# Patient Record
Sex: Female | Born: 1970 | Race: White
Health system: Southern US, Community
[De-identification: ages and names within clinical notes are randomized; demographics above are authoritative.]

## PROBLEM LIST (undated history)

## (undated) DIAGNOSIS — E119 Type 2 diabetes mellitus without complications: Secondary | ICD-10-CM

## (undated) DIAGNOSIS — I509 Heart failure, unspecified: Secondary | ICD-10-CM

## (undated) DIAGNOSIS — I4891 Unspecified atrial fibrillation: Secondary | ICD-10-CM

## (undated) HISTORY — PX: HERNIA REPAIR: SHX51

---

## 2014-05-14 IMAGING — CT CT ABD/PEL W CONT
2 of 4 series · 17 of 46 positions shown, 19 images · IV contrast (APPLIED)
Comparison: No prior studies available for comparison.

HISTORY: Abdominal pain.
TECHNIQUE: Axial images through the abdomen and pelvis at 5 mm. Coronal and sagittal images were reformatted. 

IV contrast: 100 cc Optiray 320.
Oral contrast:  None per request. 
I-STAT creatinine:

[Series 4: soft tissue · axial · 0.95mm/px · z∈[-516,-76]mm · 14 of 97 slices shown, 16 images]
[im 5/97  soft-tissue]
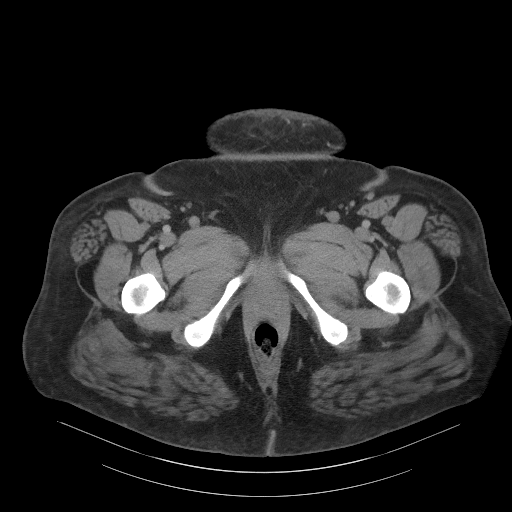
[im 5/97  bone]
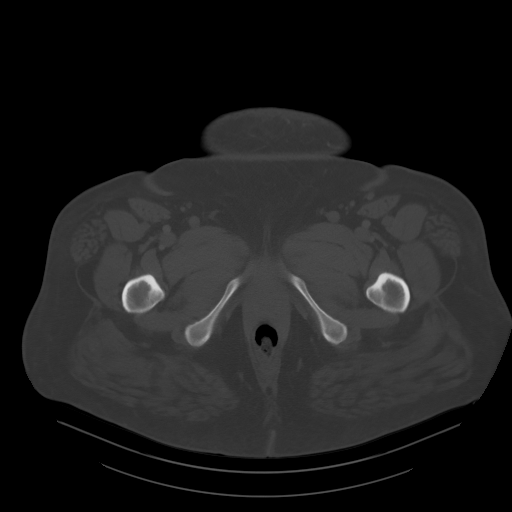
[im 13/97  soft-tissue]
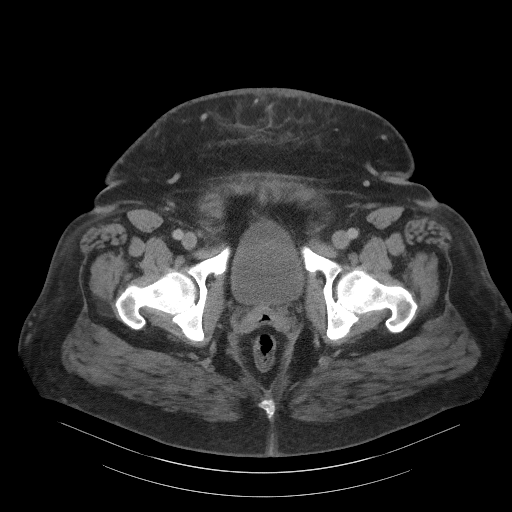
[im 21/97  soft-tissue]
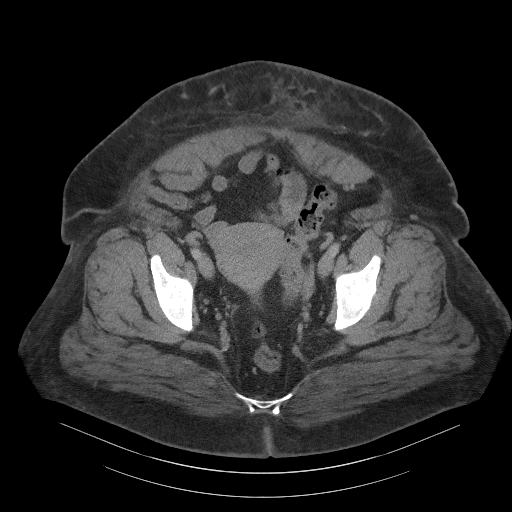
[im 25/97  soft-tissue]
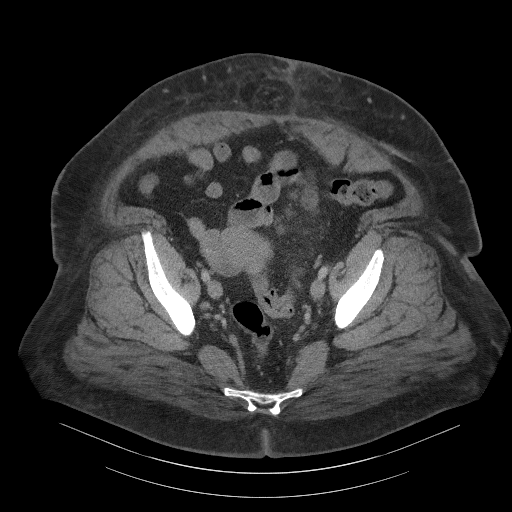
[im 33/97  soft-tissue]
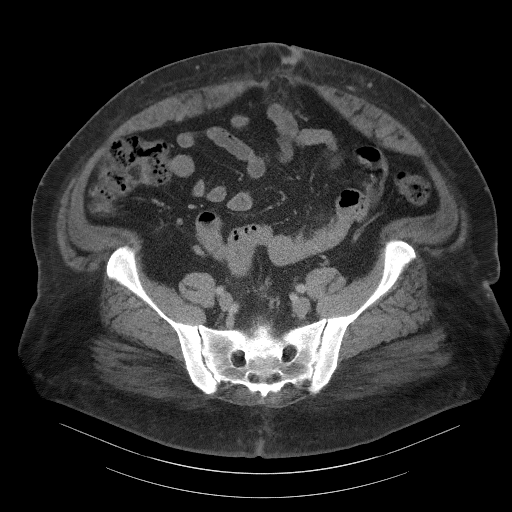
[im 41/97  soft-tissue]
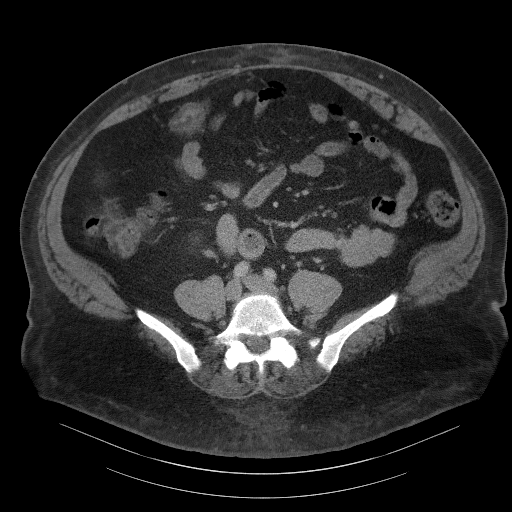
[im 45/97  soft-tissue]
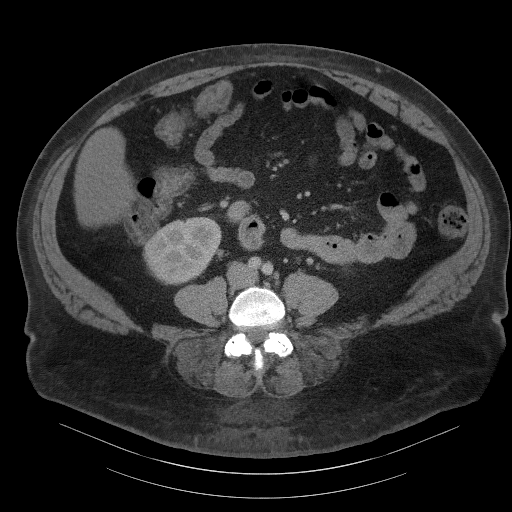
[im 53/97  soft-tissue]
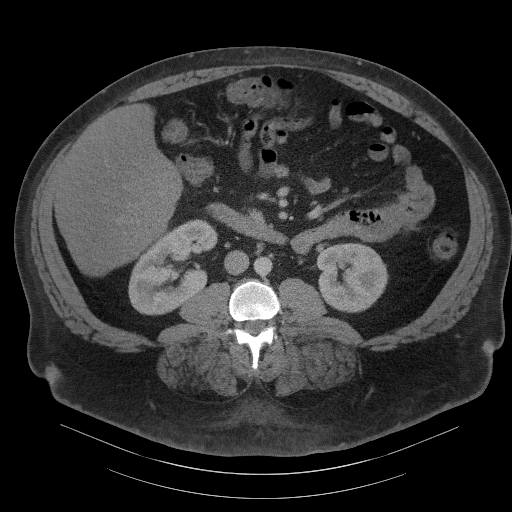
[im 57/97  soft-tissue]
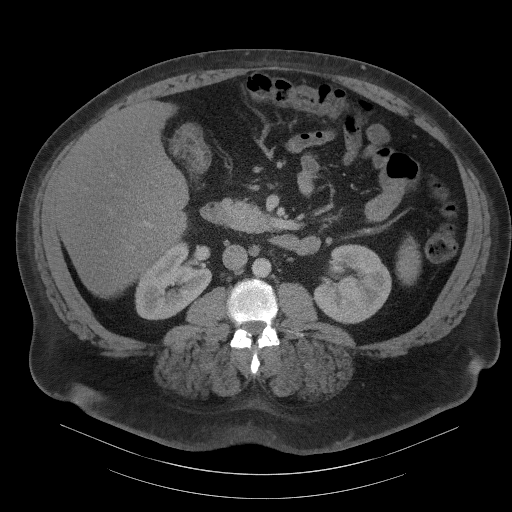
[im 57/97  bone]
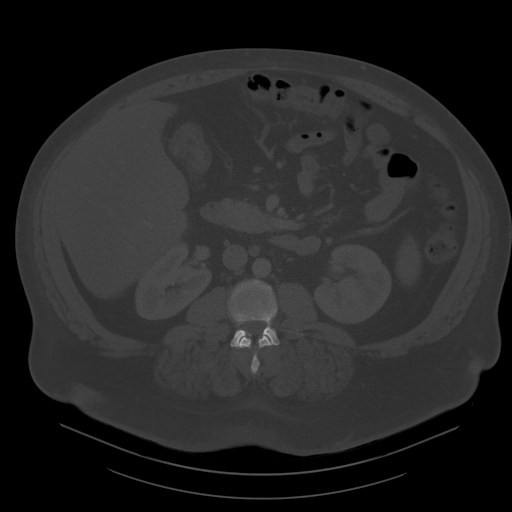
[im 65/97  soft-tissue]
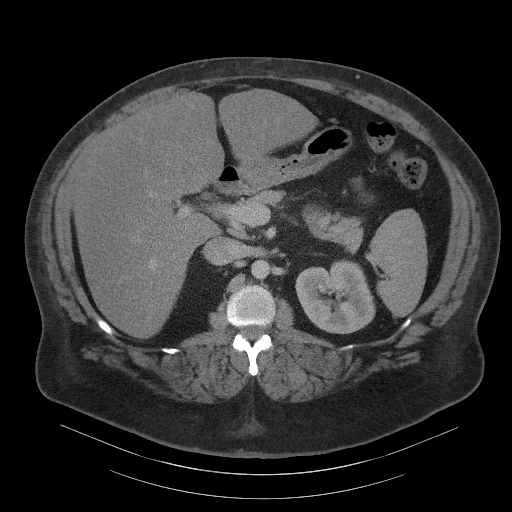
[im 73/97  soft-tissue]
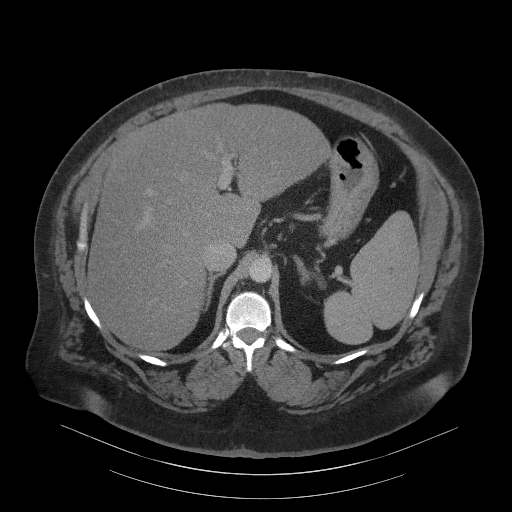
[im 77/97  soft-tissue]
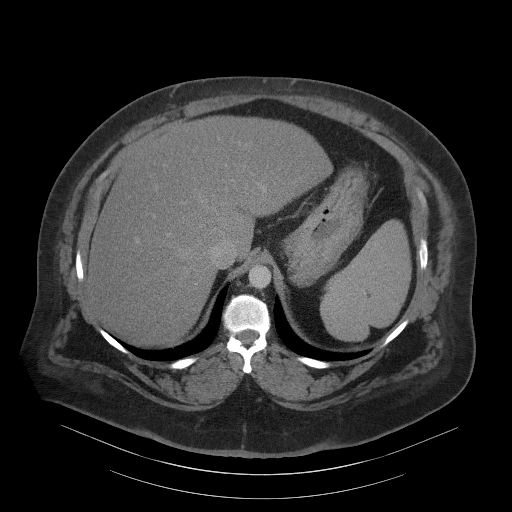
[im 85/97  soft-tissue]
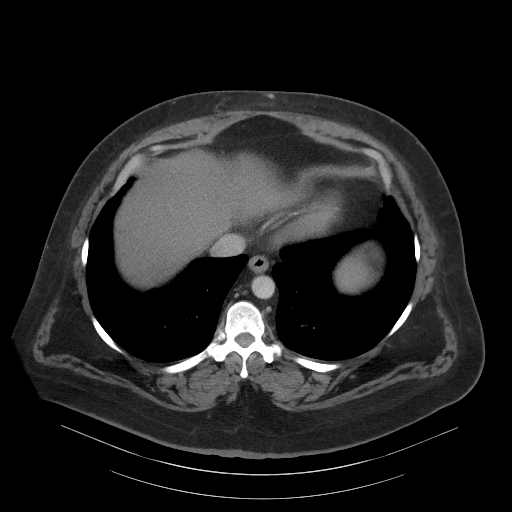
[im 93/97  soft-tissue]
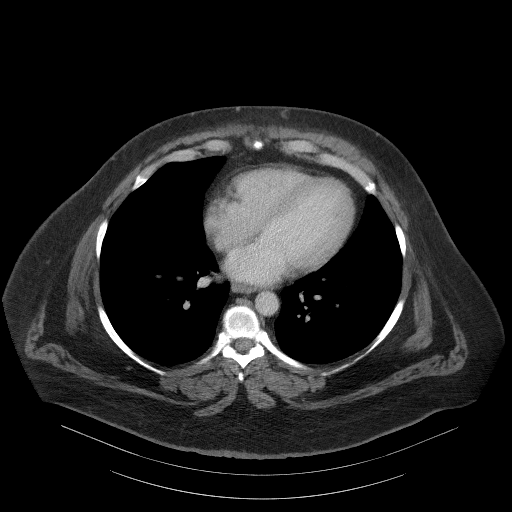

[Series 6: coronal · coronal · 0.98mm/px · 3 of 75 slices shown]
[im 25/75  soft-tissue]
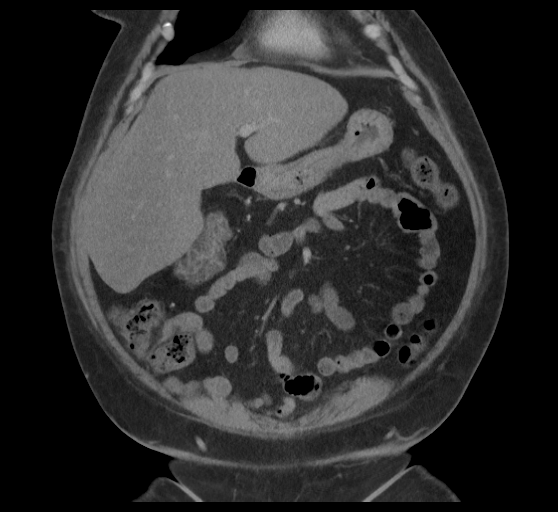
[im 33/75  soft-tissue]
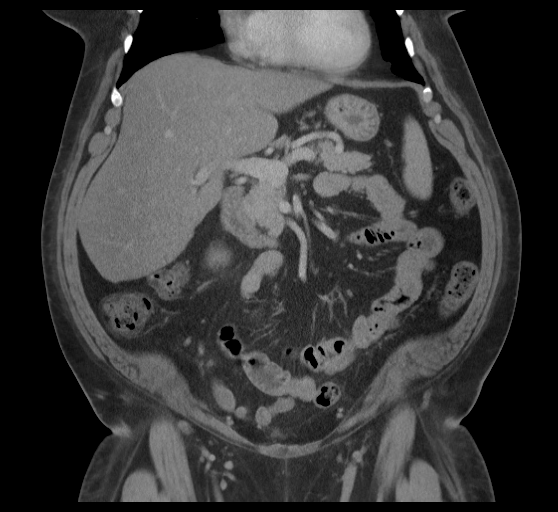
[im 42/75  soft-tissue]
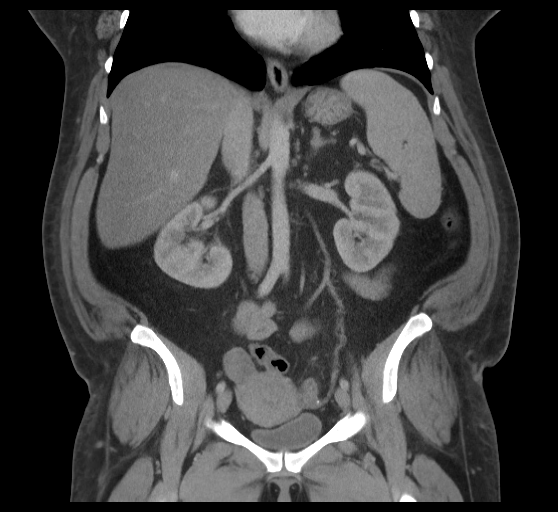

[17 of 46 positions shown; findings below may reference images not displayed]

FINDINGS: The lung bases are clear.

The liver is enlarged measuring approximately 23 cm craniocaudad dimension. No focal abnormality is visible within the liver. Patient has undergone prior cholecystectomy. The spleen, pancreas, adrenals and kidneys are unremarkable. The abdominal aorta is normal in caliber. Evaluation of the GI tract is limited by the lack of contrast.  No abnormally dilated loops of bowel are identified.

There is a fat-containing umbilical hernia. The fascial defect measures 3 cm on sagittal view. There is stranding in the subcutaneous fat of the lower anterior abdominal wall. It is difficult to determine if this is within the hernia sac itself or adjacent adipose tissue. No loculated fluid collection is demonstrated. There is no bowel involvement of the hernia.

Pelvic contents are grossly unremarkable.

Bone windows show degenerative change in the lower lumbar spine.
IMPRESSION: Fat-containing umbilical hernia. The walls of the hernia sac are not well defined. There is stranding in the lower abdominal wall which may reflect incarceration of the hernia or panniculitis or a combination of both.

## 2014-08-30 IMAGING — CR C-SPINE 4 or 5 views
1 series · 6 of 6 positions shown · non-contrast
Comparison: None.

HISTORY: Radiculopathy, pain.
TECHNIQUE: C-spine x-ray series 6 views.

[Series 2: swimmers · 0.17mm/px · 6 of 6 slices shown]
[im 1/6]
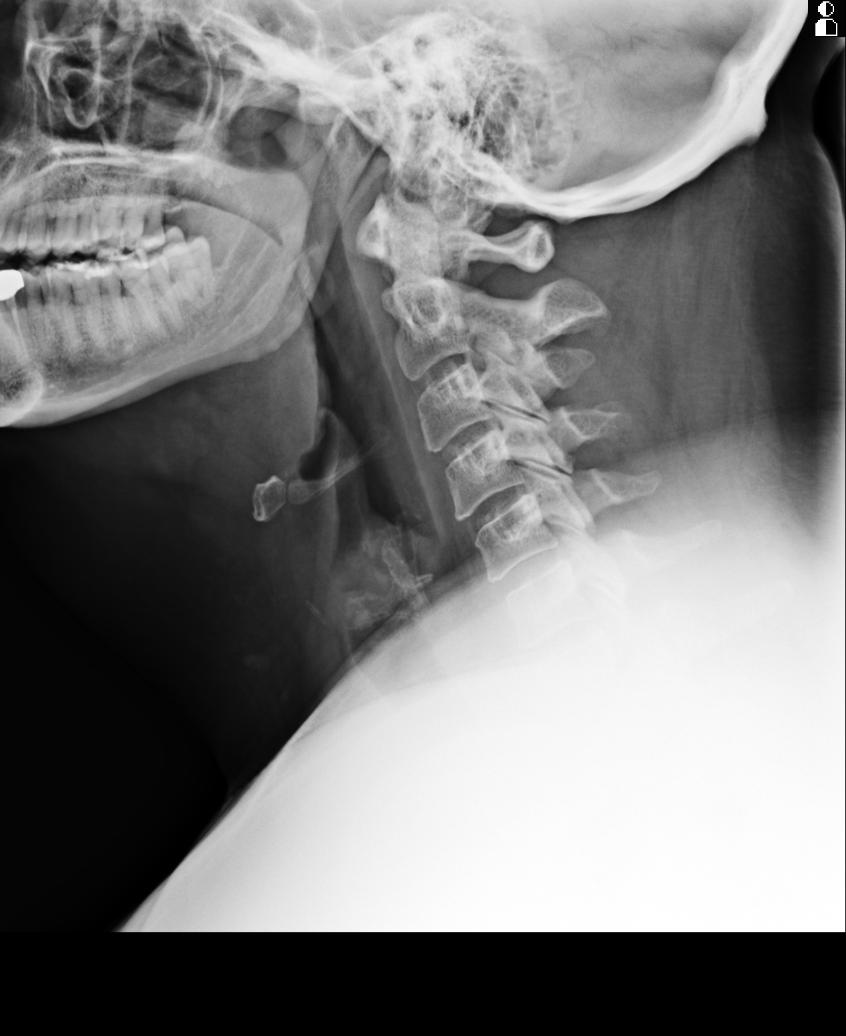
[im 2/6]
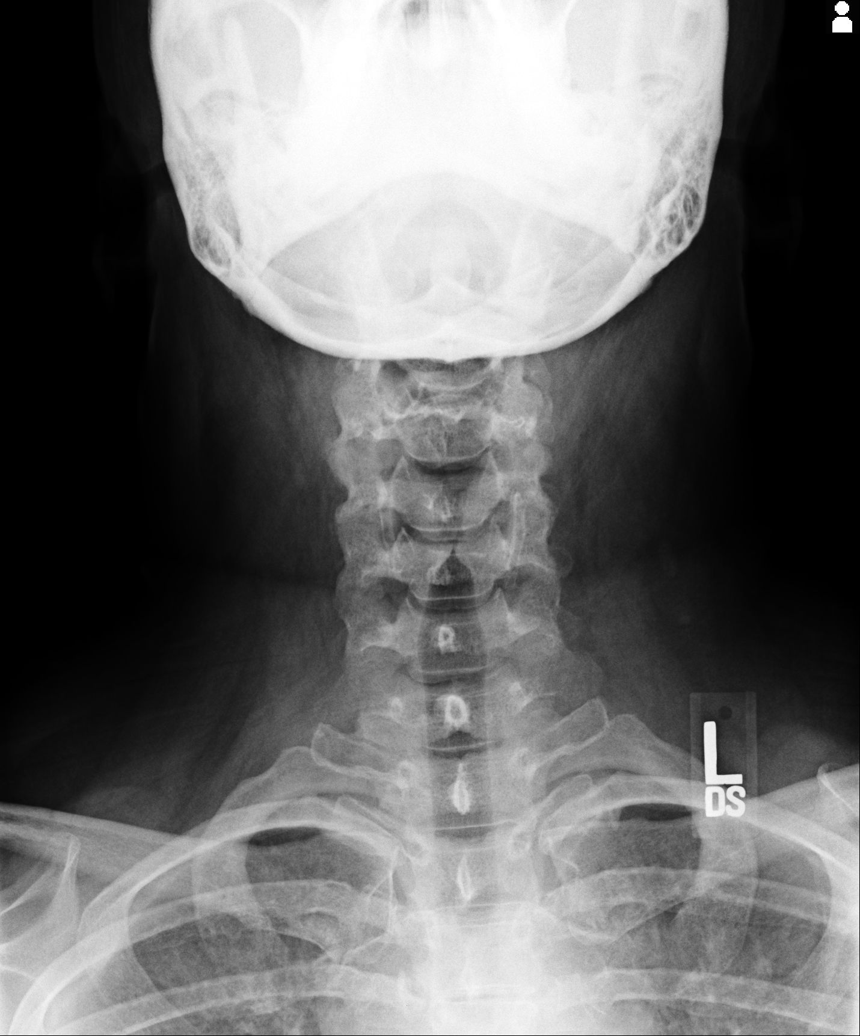
[im 3/6]
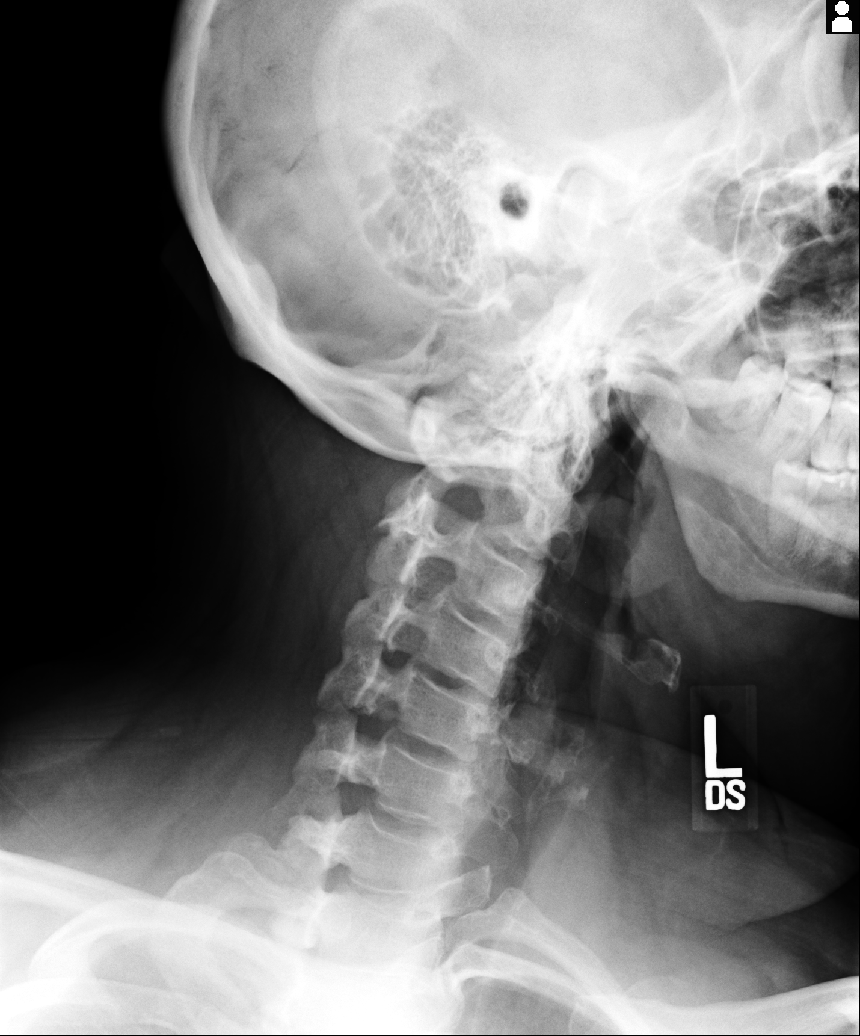
[im 4/6]
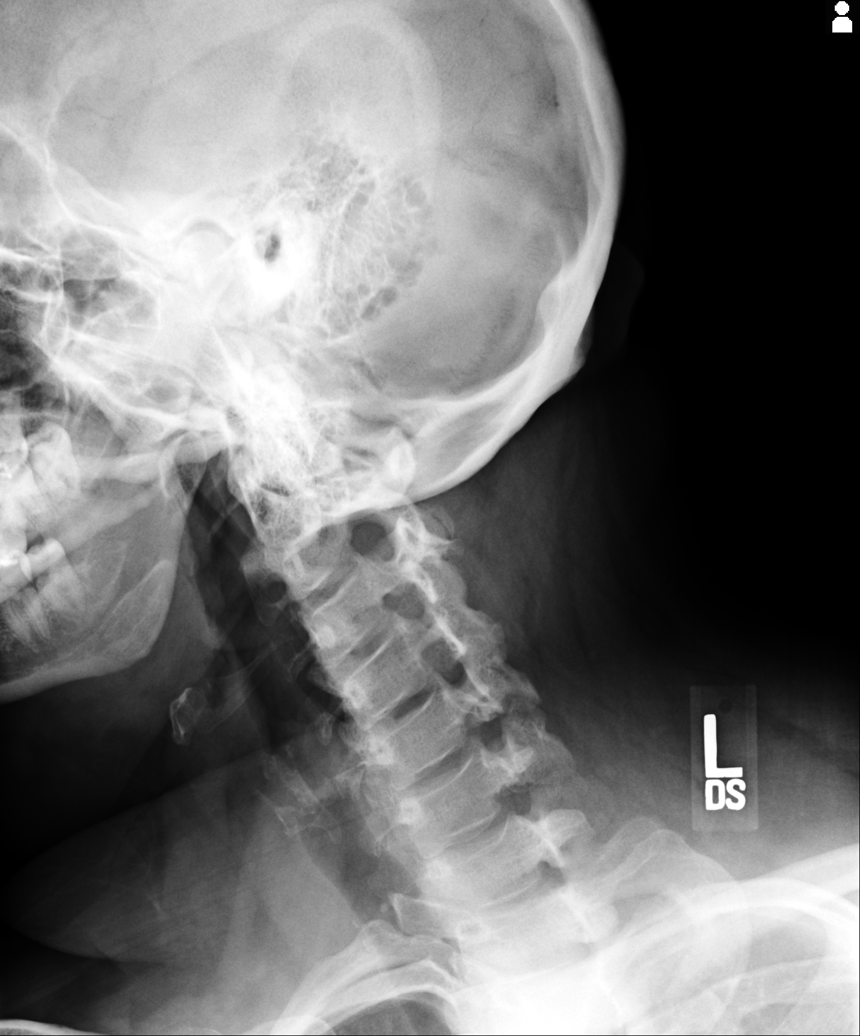
[im 5/6]
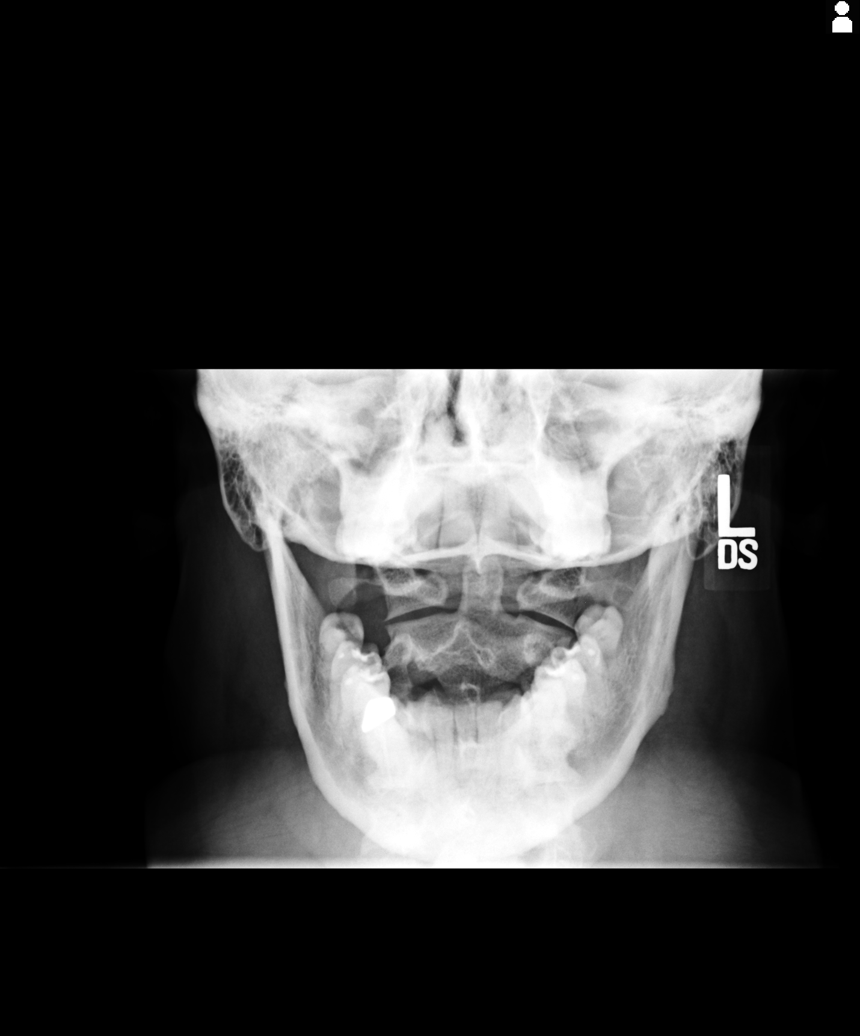
[im 6/6]
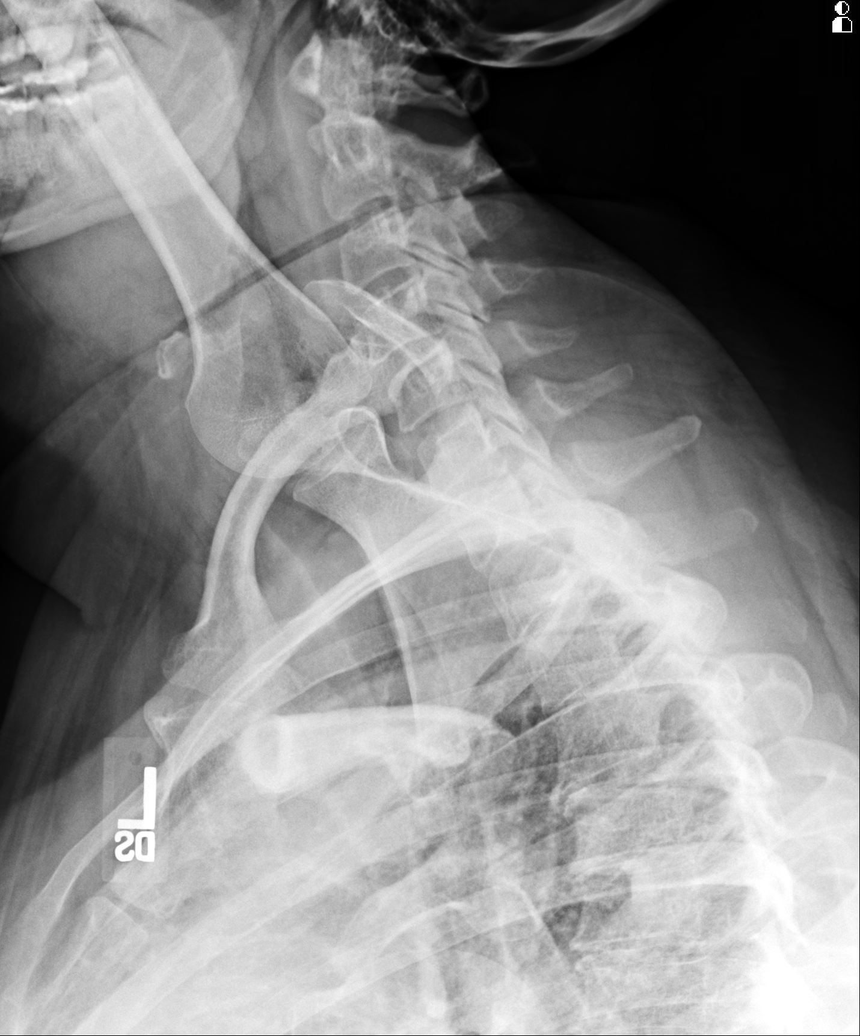

[6 of 6 positions shown; findings below may reference images not displayed]

FINDINGS: C1-C7 are visualized on the swimmer's view. There is no slippage, fracture or disc space narrowing. C1-2 is normal. No prevertebral swelling is seen. Neural foramina are open. Facets unremarkable.
IMPRESSION: Normal C-spine x-ray series.

## 2014-08-30 IMAGING — US US NON OB TRANSVAGINAL W LTD TA
1 series · 14 of 28 positions shown · non-contrast
Comparison: CT abdomen pelvis 05/14/2014

HISTORY: 43 year Female with Pelvic pain.
TECHNIQUE: Transabdominal and transvaginal ultrasound examinations of pelvis were performed.

[Series 1: us non ob transvaginal w ltd ta · 0.28mm/px · 14 of 39 slices shown]
[im 2/39]
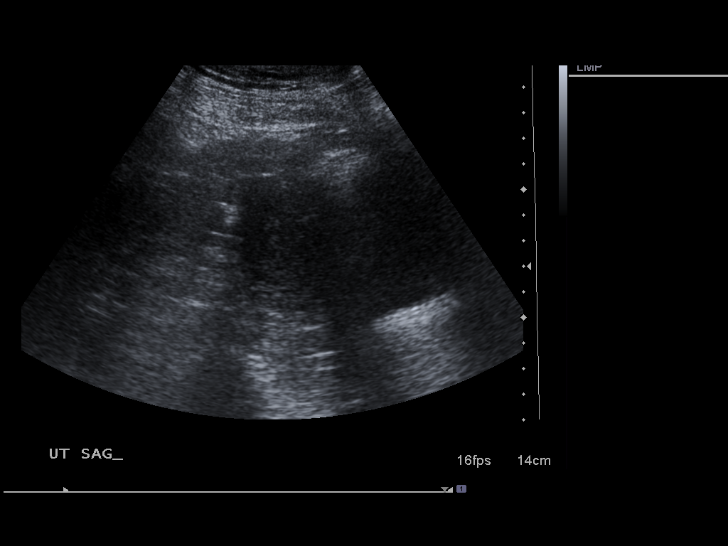
[im 5/39]
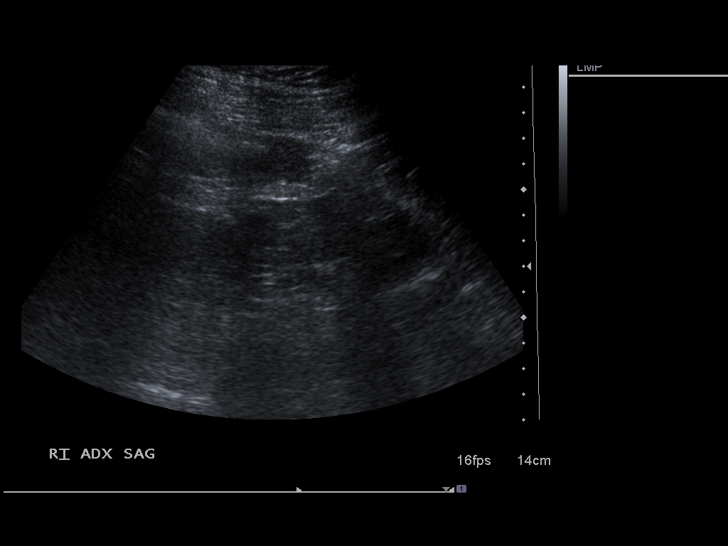
[im 8/39]
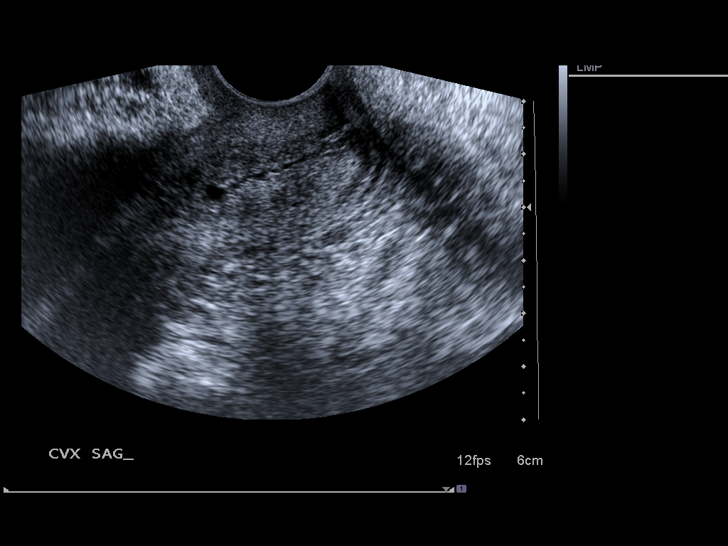
[im 10/39]
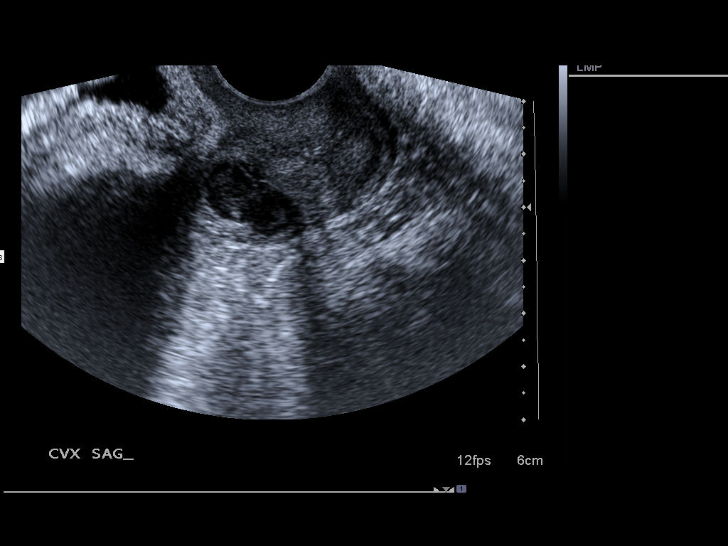
[im 13/39]
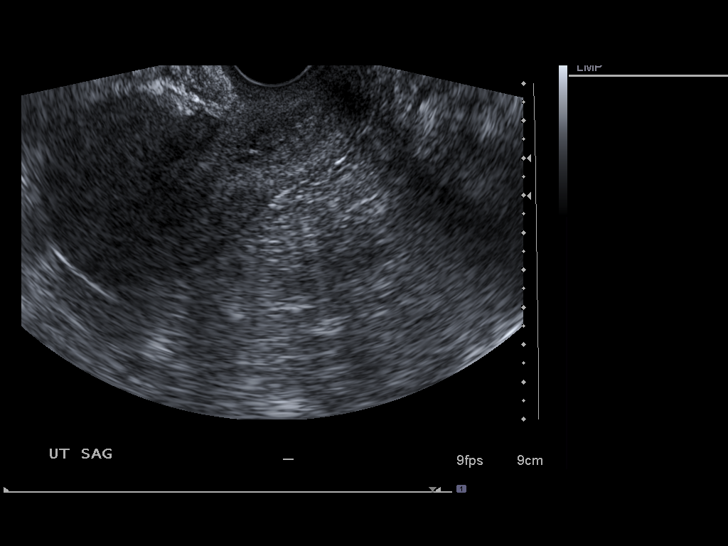
[im 16/39]
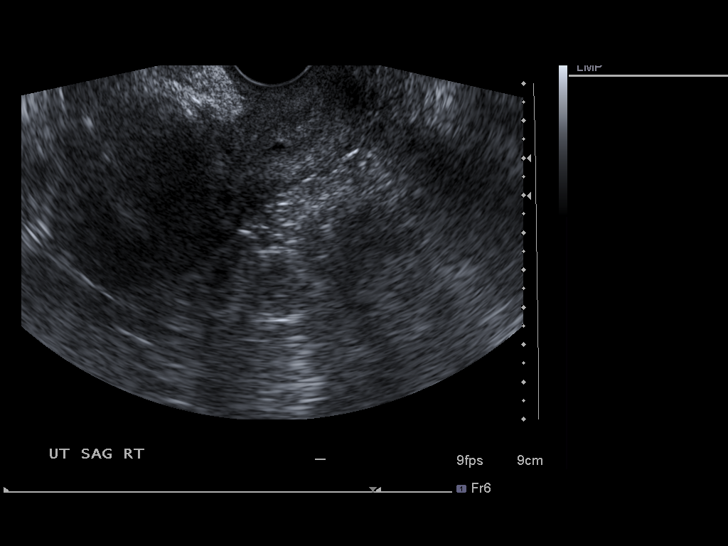
[im 19/39]
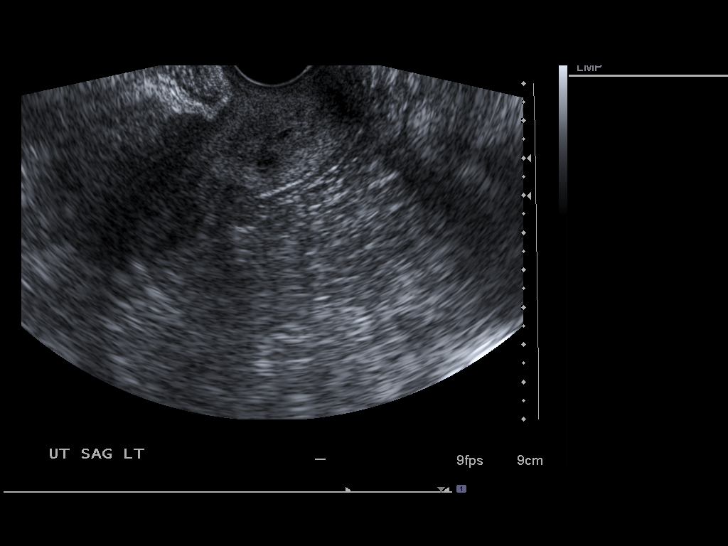
[im 22/39]
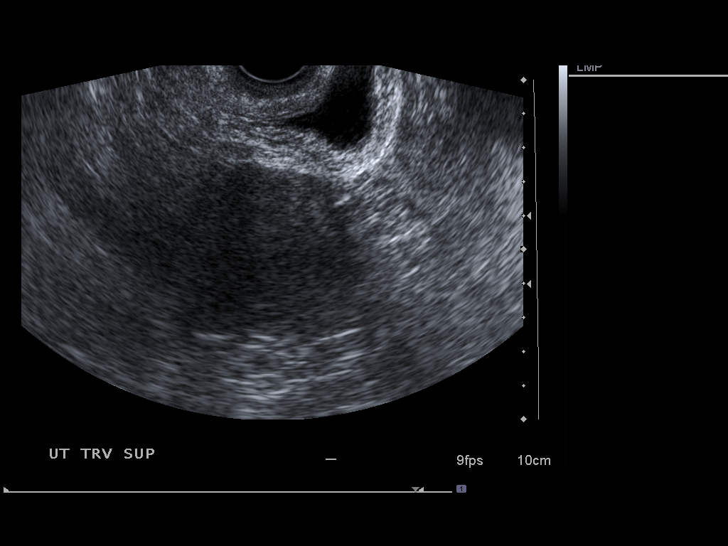
[im 24/39]
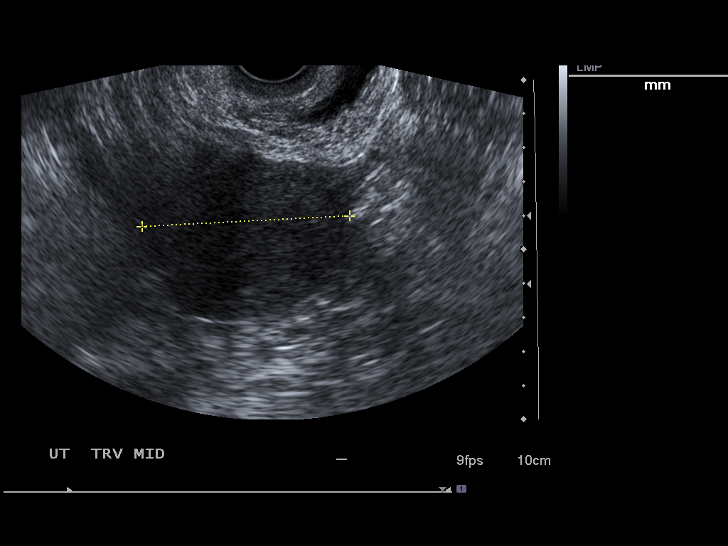
[im 27/39]
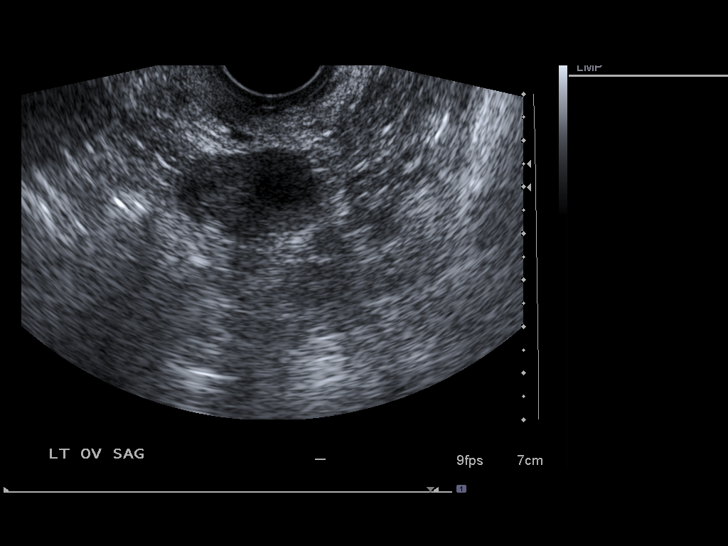
[im 30/39]
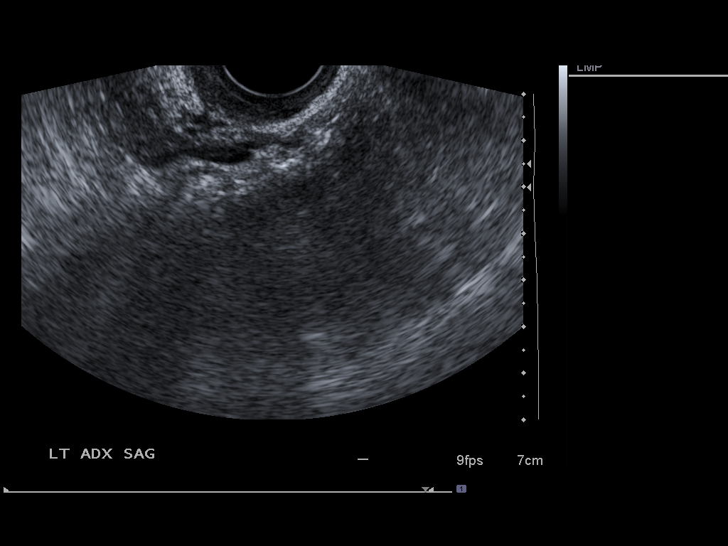
[im 33/39]
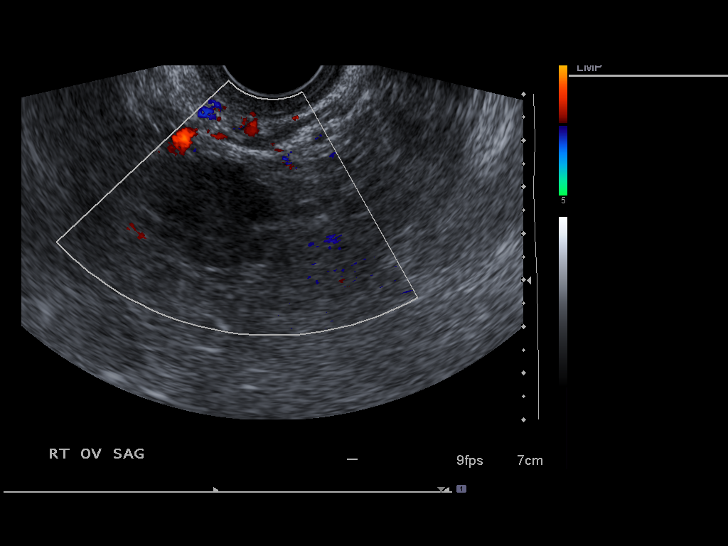
[im 36/39]
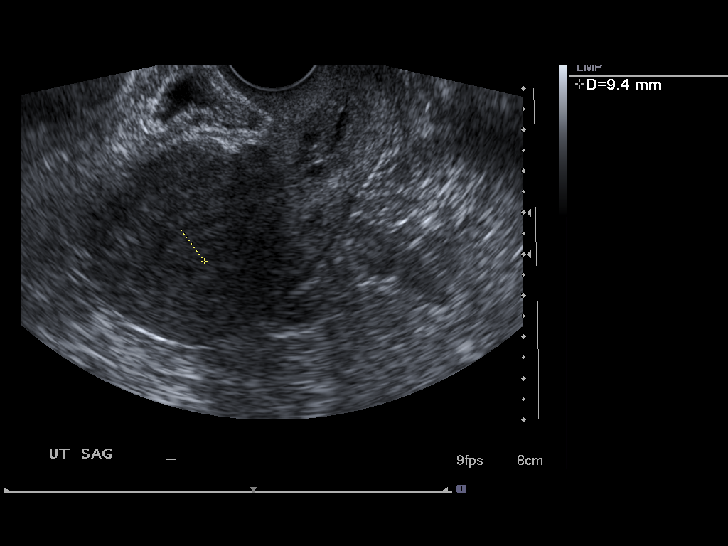
[im 39/39]
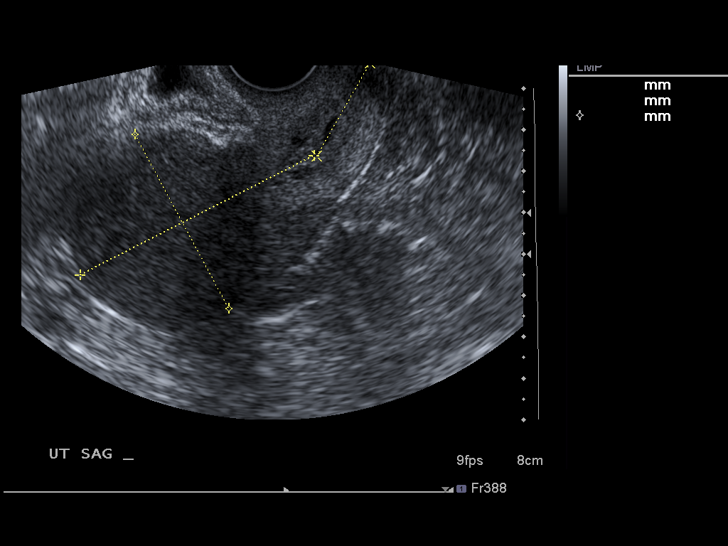

[14 of 28 positions shown; findings below may reference images not displayed]

FINDINGS: The uterus is anteverted and homogenous in echogenicity. The uterus measures 8.9 x 4.8 x 6.1 cm. 

In the right posterior cervical wall, there is a lobulated hypoechoic and anechoic lesion measuring 2.1 x 1.2 x 1.5 cm. This contains no internal vascularity on color Doppler imaging. This probably represents a complicated nabothian cyst with internal proteinaceous debris.

The endometrium measures 9 mm and is uniform in appearance. 

The right ovary measures 3.3 x 2.5 x 1.6 cm. There are small follicles in the right ovary measuring up to 1.2 cm.

The left ovary measures 3.3 x 2.2 x 2.3 cm. There are small follicles in the left ovary, measuring up to 1.9 cm.

There is no free fluid in the pelvis.
IMPRESSION: Lobulated cystic structure in the right posterior cervical wall with internal echoes, measures up to 2.1 cm. This likely represents a complicated nabothian cyst with internal proteinaceous debris. Recommend correlation with physical exam and Pap smear.

## 2014-08-30 IMAGING — US US THYROID
1 series · 14 of 23 positions shown · non-contrast
Comparison: No prior studies are available for review.

HISTORY: Enlarged thyroid nodules.
TECHNIQUE: Thyroid ultrasound.

[Series 1: us thyroid · 0.09mm/px · 14 of 23 slices shown]
[im 1/23]
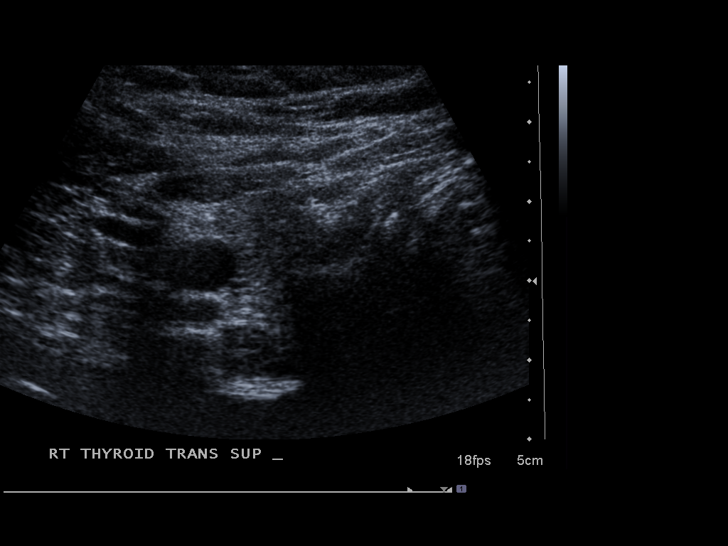
[im 3/23]
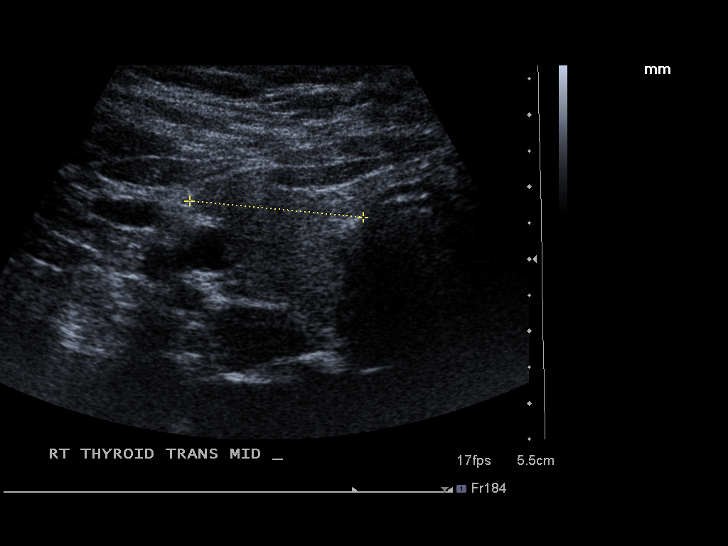
[im 5/23]
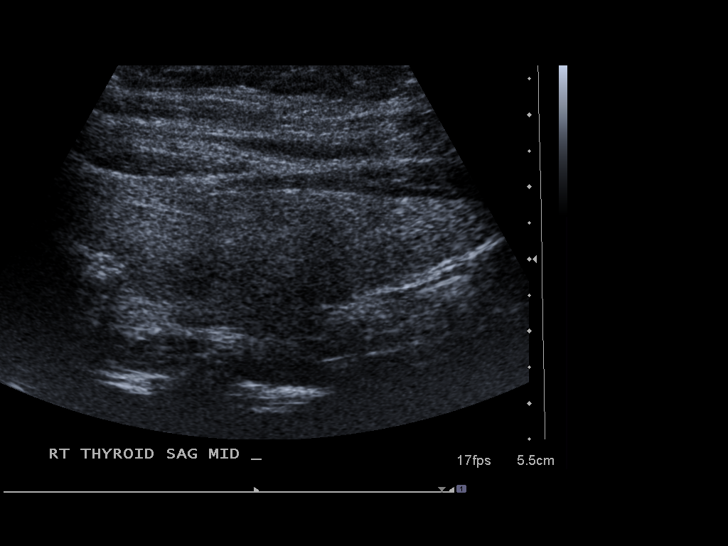
[im 6/23]
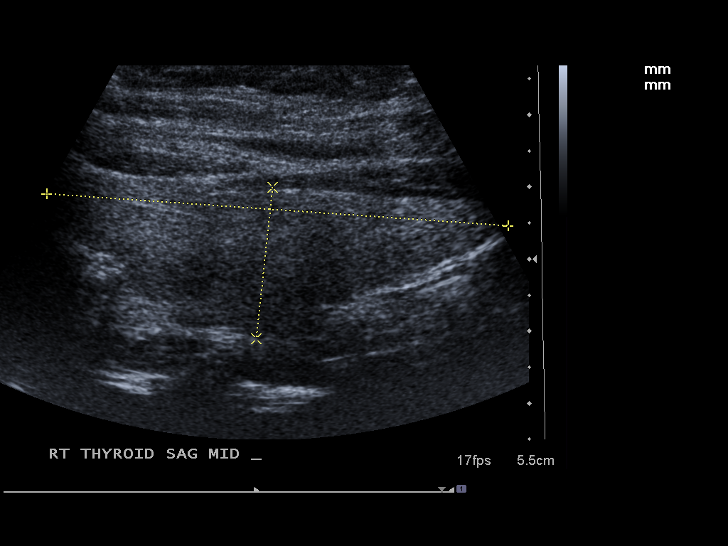
[im 8/23]
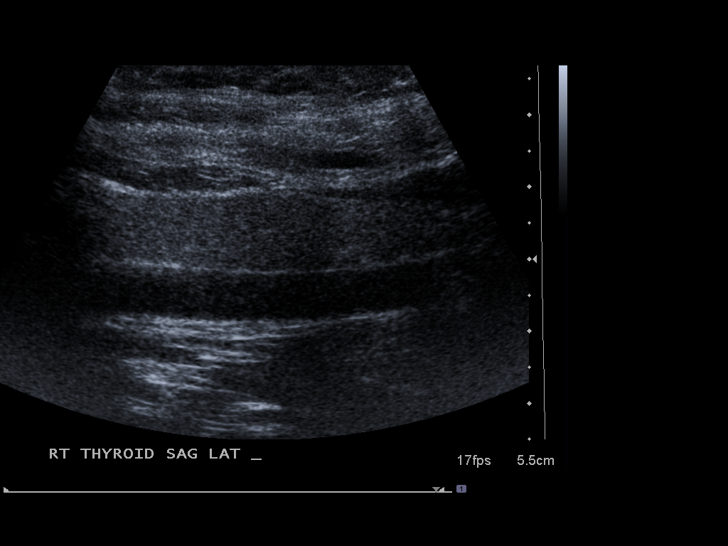
[im 10/23]
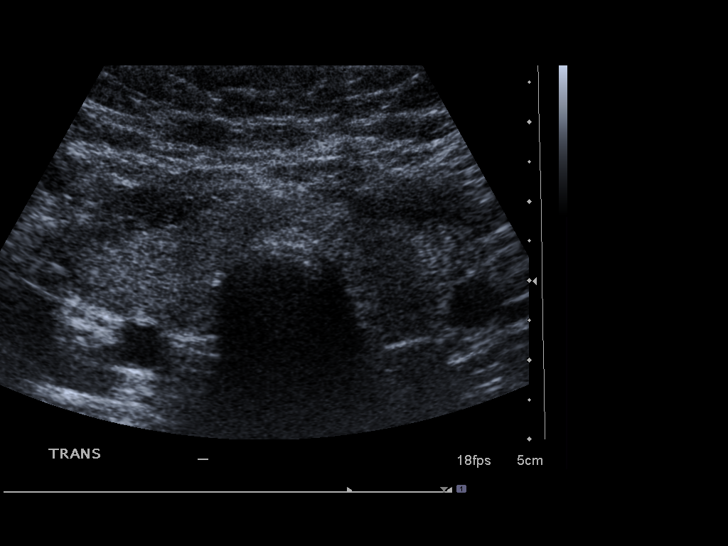
[im 11/23]
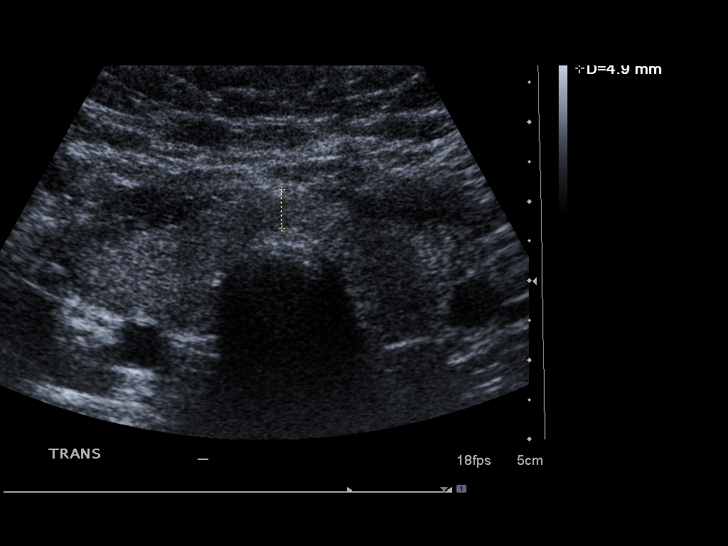
[im 13/23]
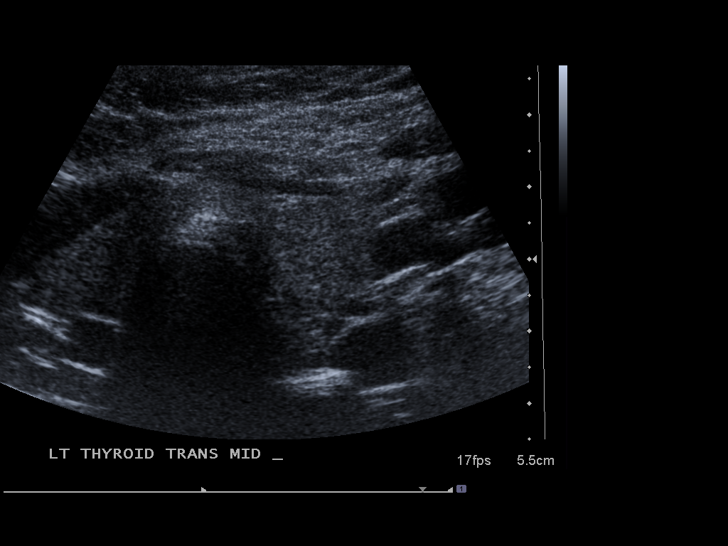
[im 14/23]
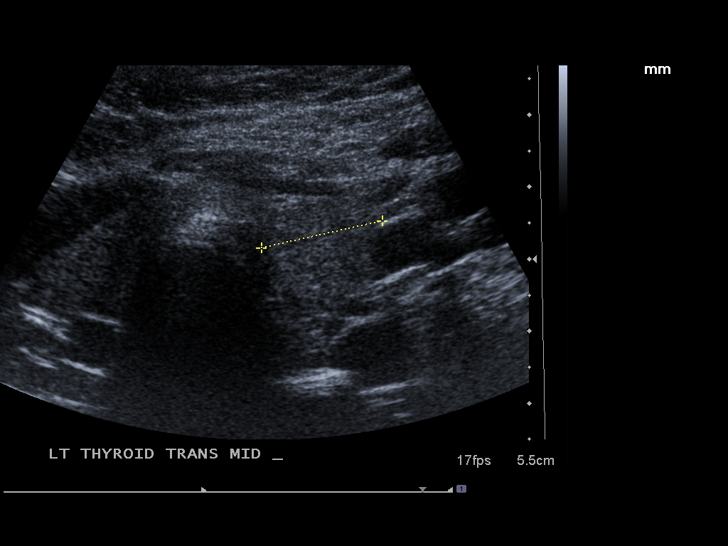
[im 16/23]
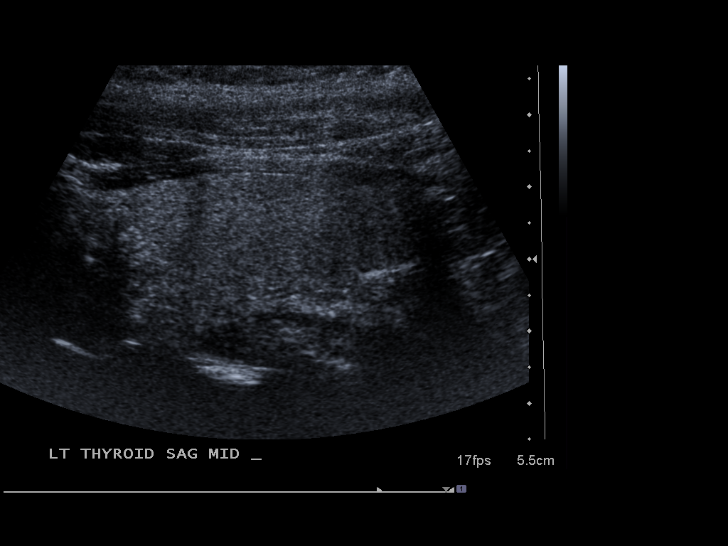
[im 18/23]
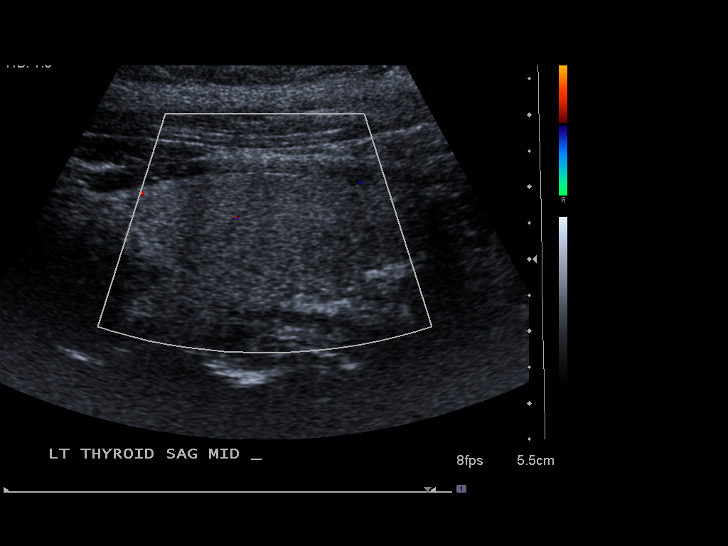
[im 19/23]
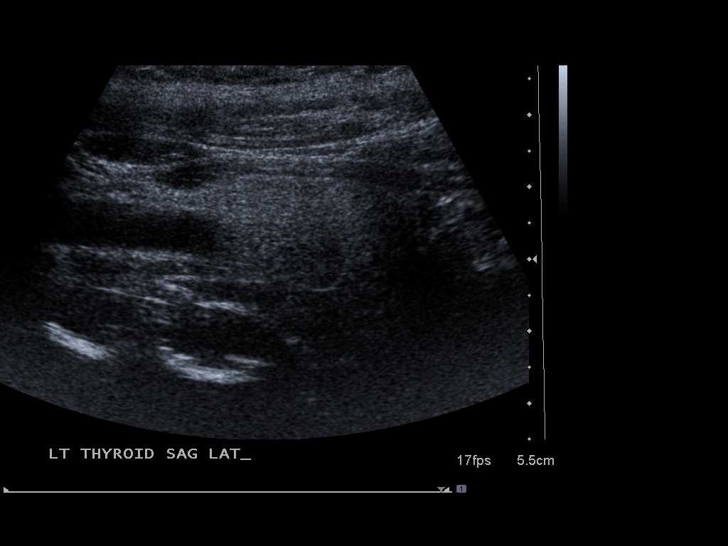
[im 21/23]
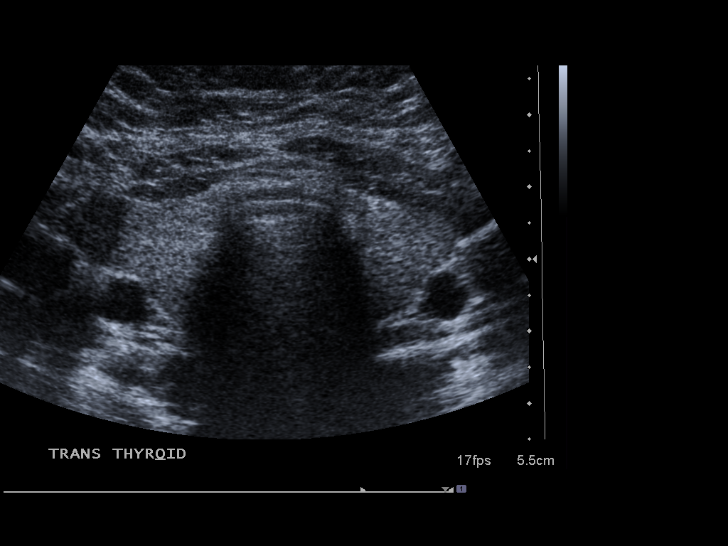
[im 23/23]
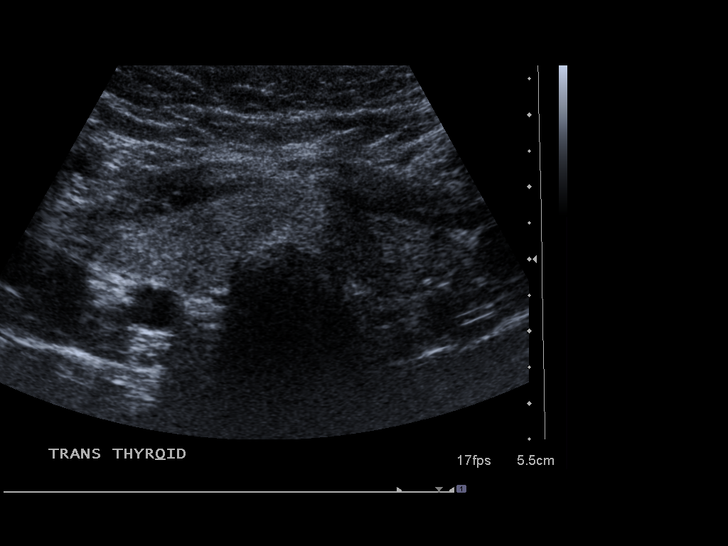

[14 of 23 positions shown; findings below may reference images not displayed]

FINDINGS: There is mild inhomogeneous echotexture of both thyroid notes without colloid cyst or nodule. No increased blood flow is noted within the thyroid gland.

RIGHT LOBE:  64 x 21 x 24 mm  

LEFT LOBE:  47 x 23 x 17 mm

ISTHMUS:  5 mm

THYROID NODULES: None.
IMPRESSION: Mild thyromegaly without convincing colloid cyst or solid nodule.

## 2015-01-31 IMAGING — CT CT ABD/PEL W CONT
2 of 4 series · 17 of 46 positions shown, 19 images · IV contrast (APPLIED)
Comparison: Previous CT of the abdomen and pelvis May 14, 2014.

INDICATION: Epigastric pain.

[Series 3: soft tissue · axial · 0.97mm/px · z∈[-551,-141]mm · 14 of 90 slices shown, 16 images]
[im 4/90  soft-tissue]
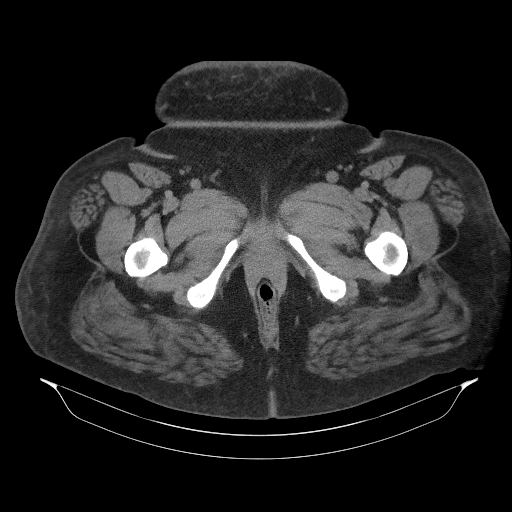
[im 4/90  bone]
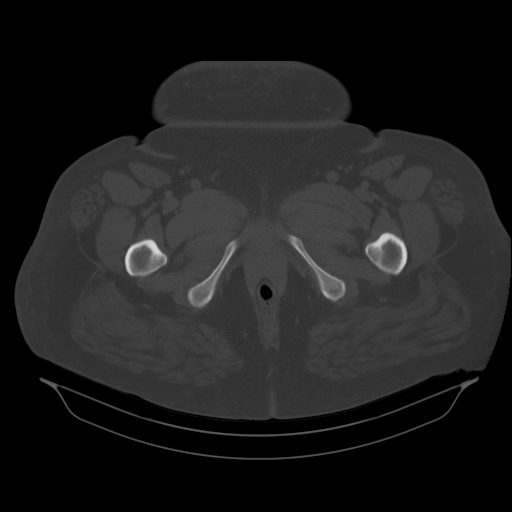
[im 11/90  soft-tissue]
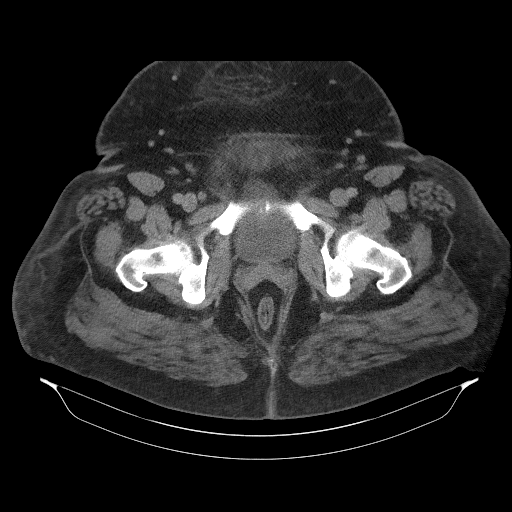
[im 18/90  soft-tissue]
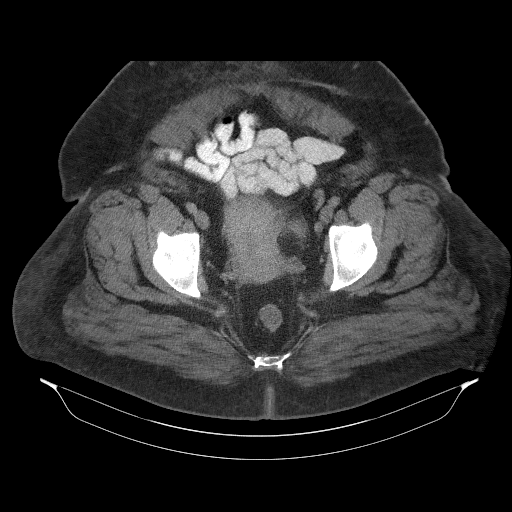
[im 25/90  soft-tissue]
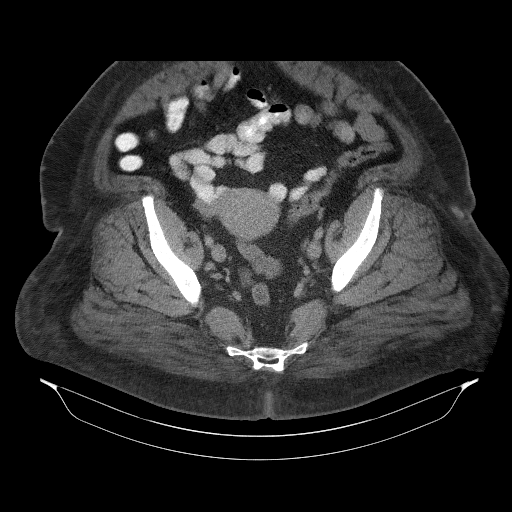
[im 29/90  soft-tissue]
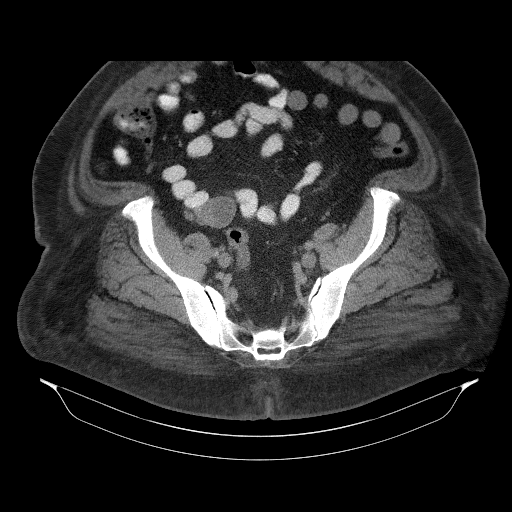
[im 36/90  soft-tissue]
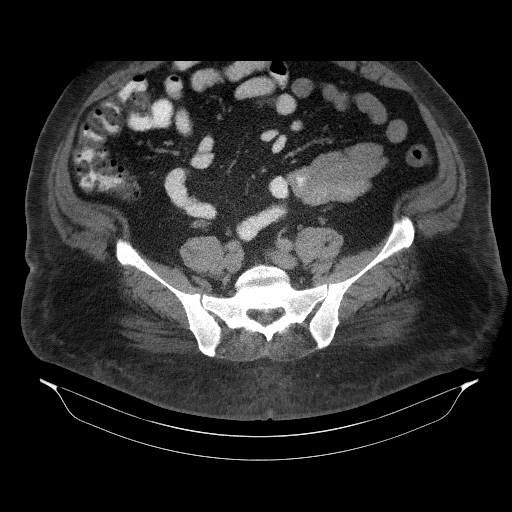
[im 43/90  soft-tissue]
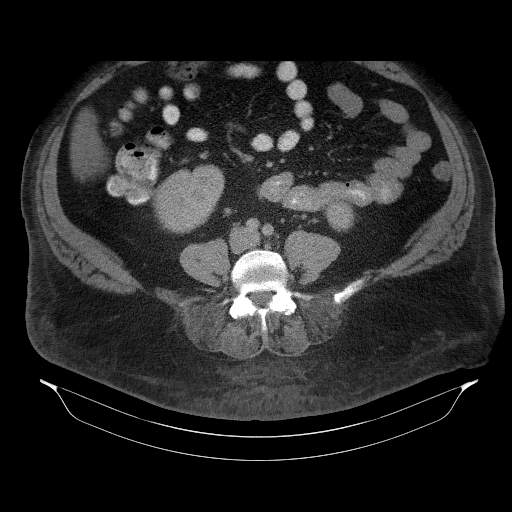
[im 47/90  soft-tissue]
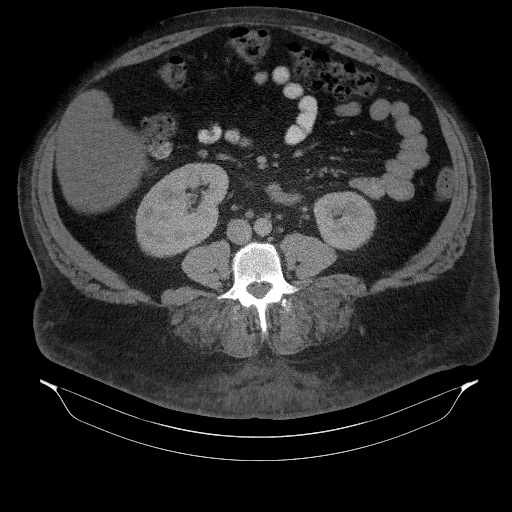
[im 54/90  soft-tissue]
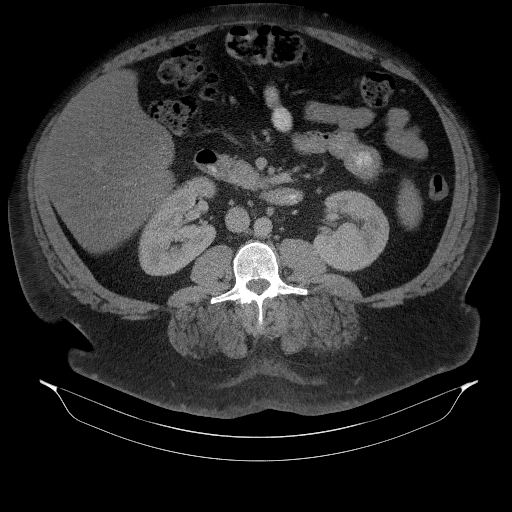
[im 54/90  bone]
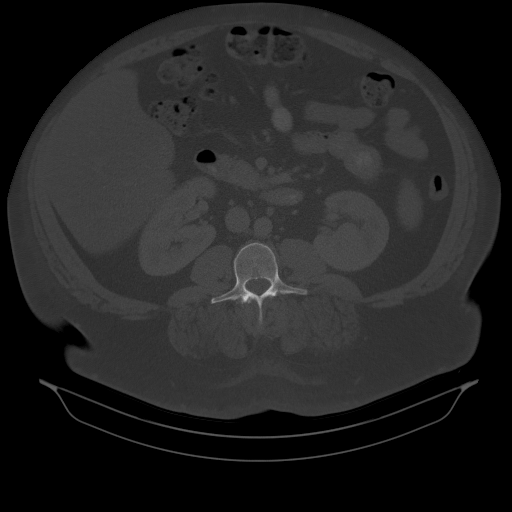
[im 61/90  soft-tissue]
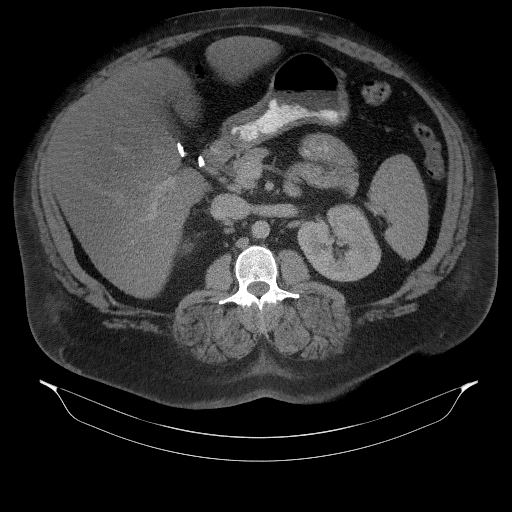
[im 68/90  soft-tissue]
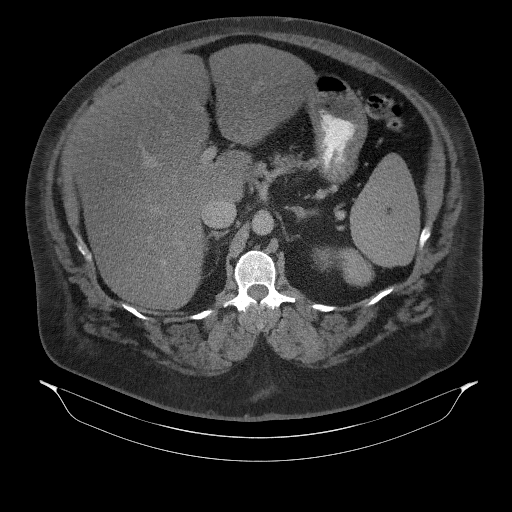
[im 72/90  soft-tissue]
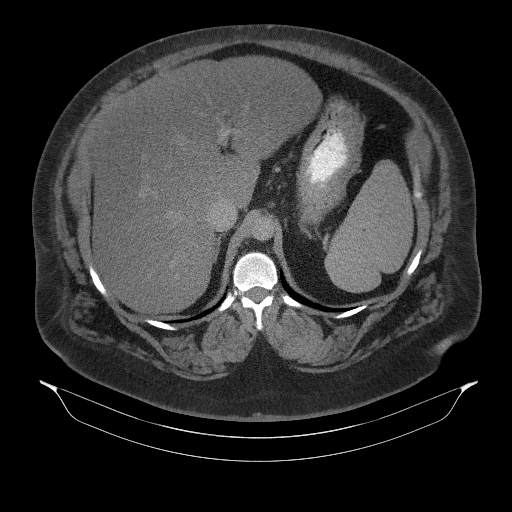
[im 79/90  soft-tissue]
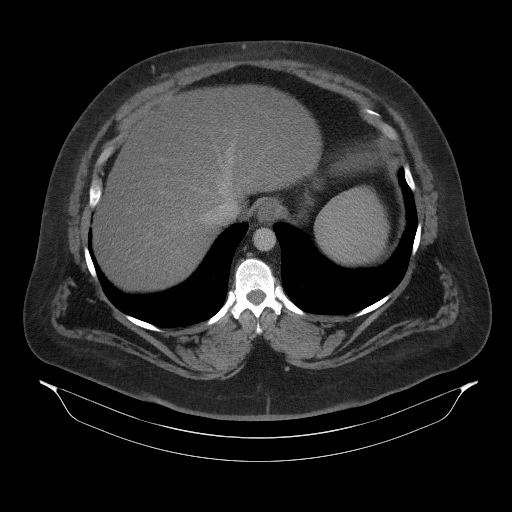
[im 86/90  soft-tissue]
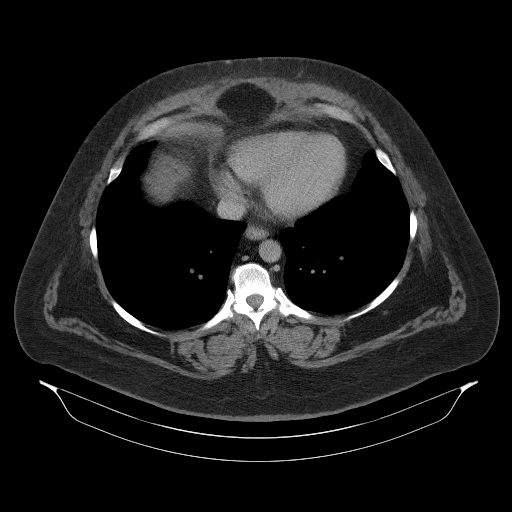

[Series 5: coronal · coronal · 0.91mm/px · 3 of 74 slices shown]
[im 25/74  soft-tissue]
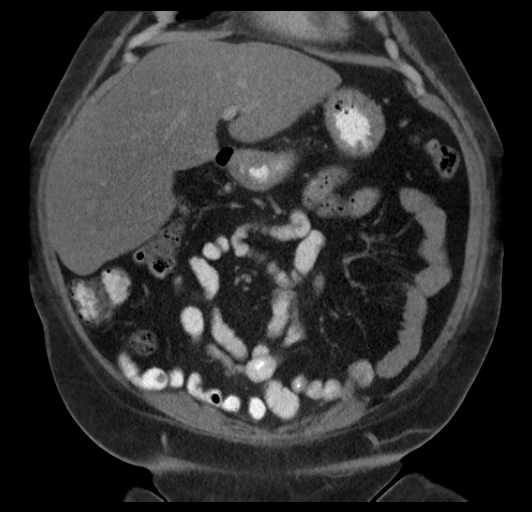
[im 33/74  soft-tissue]
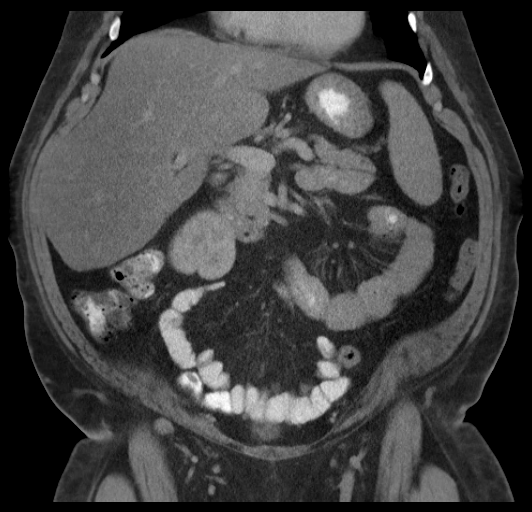
[im 41/74  soft-tissue]
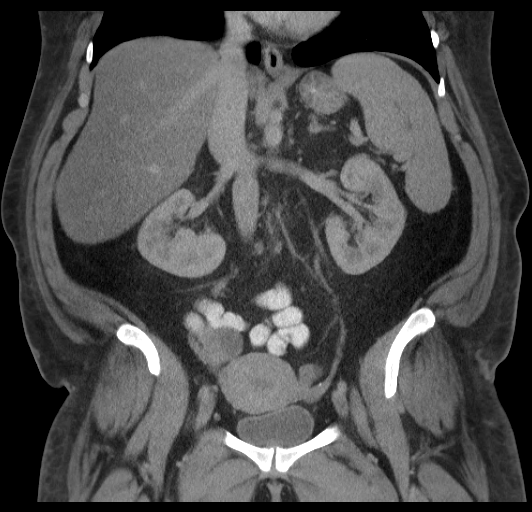

[17 of 46 positions shown; findings below may reference images not displayed]

PROCEDURE: CT of the abdomen and pelvis with contrast. Axial helical 5 mm slices were performed through the abdomen and pelvis after the intravenous administration of 83 cc of Optiray 320 and an oral dose of approximately 1 liter of diluted Gastroview. The i-STAT creatinine is 0.4 milligrams per deciliter adequate for the intravenous administration of contrast. Coronal and sagittal reformatted images of the abdomen and pelvis were performed.
FINDINGS: The visualized lower chest shows normal heart size. No pleural or pericardial effusion is seen. No pulmonary nodule or infiltrate is seen. Distal esophagus and aorta are normal.

The liver is homogeneously hypodense when compared to the spleen in keeping with fatty liver infiltration. The gallbladder is surgically absent. The spleen, the pancreas, the adrenal glands and the kidneys are normal. The abdominal aorta is of normal caliber. No free air or fluid is seen. No evidence of abdominal hernia is seen. 

At the pelvis, the uterus, the ovaries, the distal ureters and urinary bladder are normal. No free fluid is seen in the cul-de-sac. No stranding of the pelvic fat is seen to suggest an inflammatory process.
IMPRESSION: 1. Previous cholecystectomy.

2. Diffuse fatty liver infiltration.

3. No inflammatory process is seen in the abdomen and pelvis.

## 2017-10-10 IMAGING — PT PET CT SKULL BASE TO THIGH_STAGING
3 series · 25 of 25 positions shown · non-contrast
Comparison: [REDACTED] chest CTA dated 08/11/2017.

Height: 65 inches. Weight: 291.0 pounds.
INDICATION: Right lung base nodule measuring 2.2 cm seen on recent chest CTA. This is new compared to prior study. There has been no biopsy. FDG PET/CT evaluation desired. History is notable for hernia repair, cholecystectomy and cesarean section x2 with tubal ligation.
TECHNIQUE: The patient's serum glucose was 147 mg/dL at time of the study.  The patient was intravenously injected with 10.31 mCi F-18 Fluorodeoxyglucose in a left wrist vein.  The patient rested quietly for 61 minutes and then received attenuation corrected PET/CT imaging with Time of Flight Protocol from the skull base to mid thigh. PET, CT, and fused images were reviewed at the reading station. SUV is corrected based on lean body mass. Images are adequate for review.

[Series 3: pet ac · axial · 5.0mm · 4.07mm/px · z∈[-1002,-44]mm · 11 of 320 slices shown]
[im 1/320]
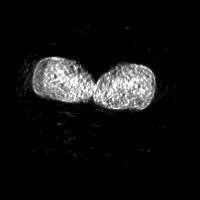
[im 32/320]
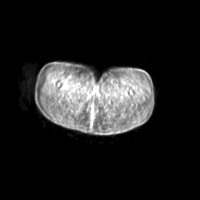
[im 64/320]
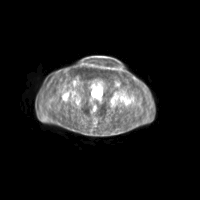
[im 96/320]
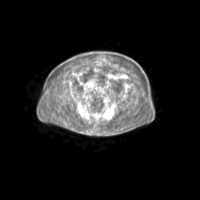
[im 128/320]
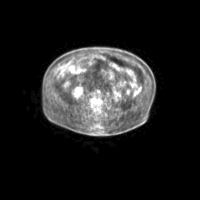
[im 160/320]
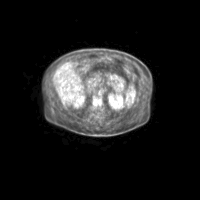
[im 192/320]
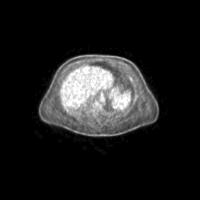
[im 224/320]
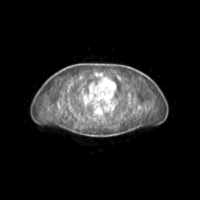
[im 256/320]
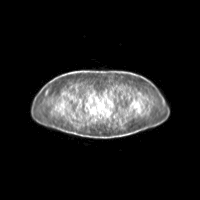
[im 288/320]
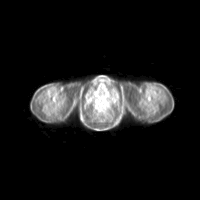
[im 320/320]
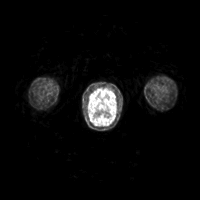

[Series 4: ct pet 4.0 b30f · axial · 4.0mm · 0.98mm/px · z∈[-1002,-44]mm · 12 of 314 slices shown]
[im 1/314]
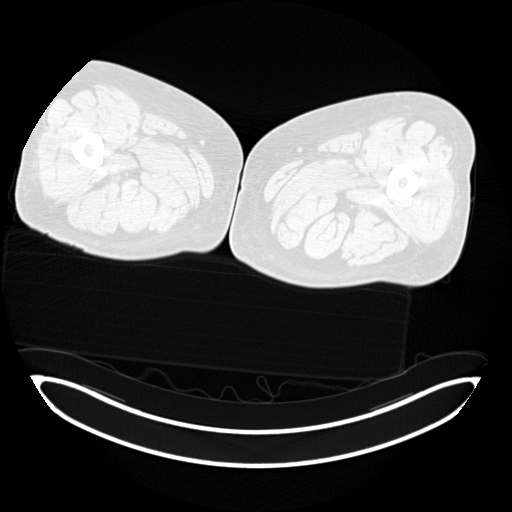
[im 29/314]
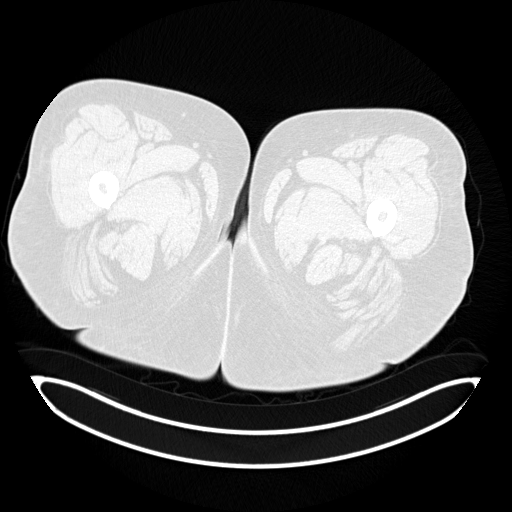
[im 57/314]
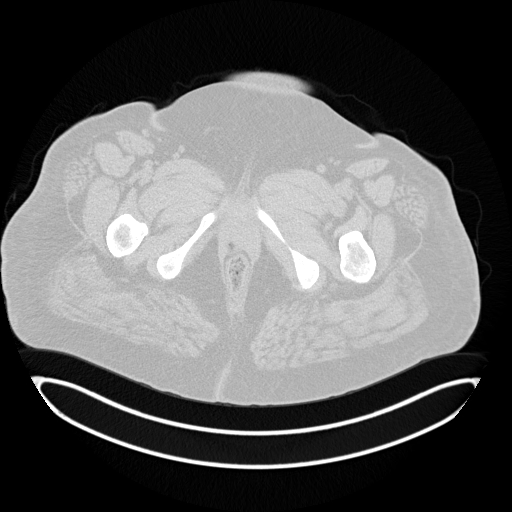
[im 86/314]
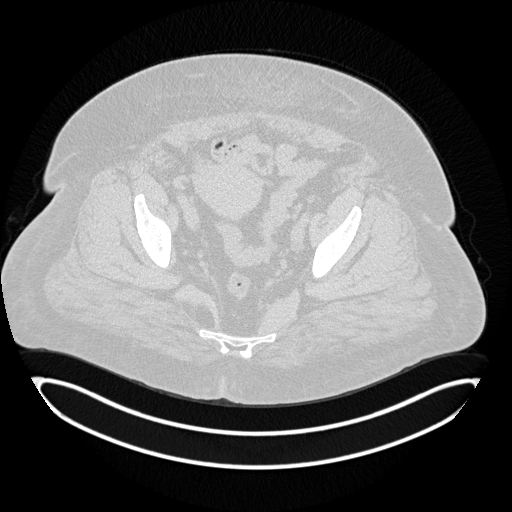
[im 114/314]
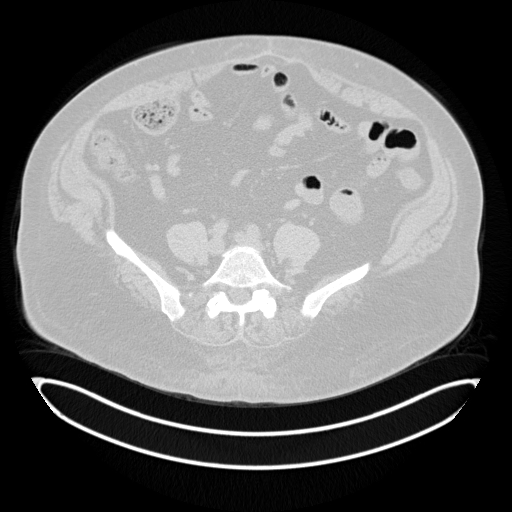
[im 143/314]
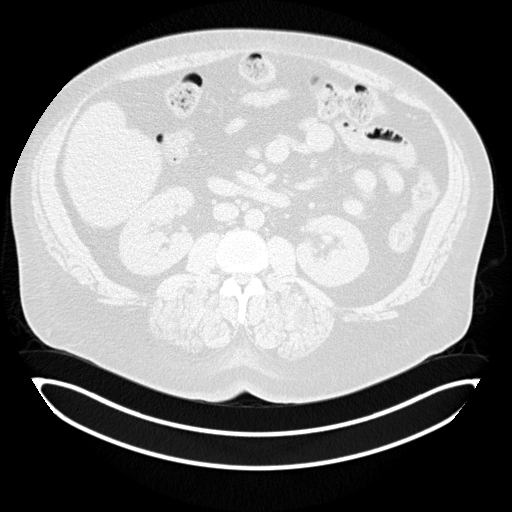
[im 171/314  brain]
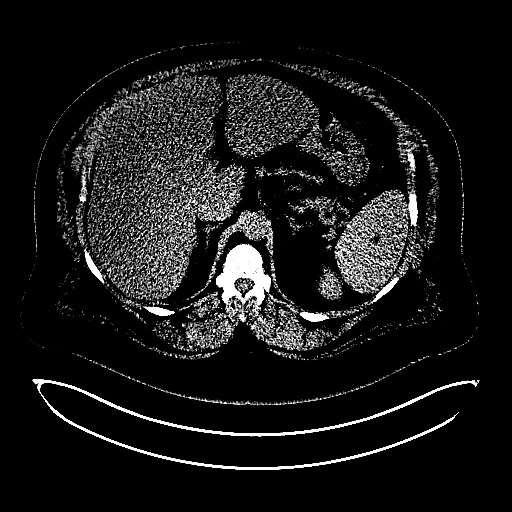
[im 200/314]
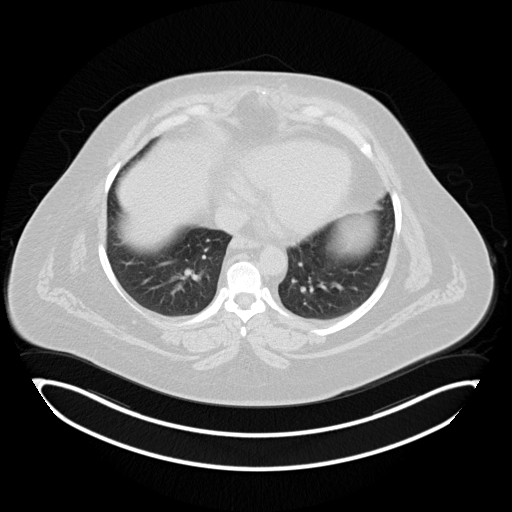
[im 228/314]
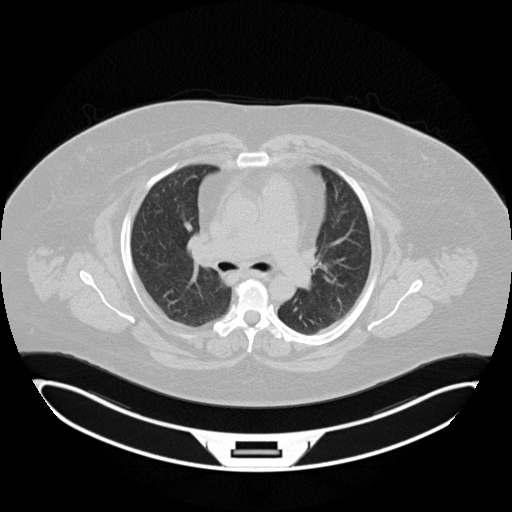
[im 257/314  brain]
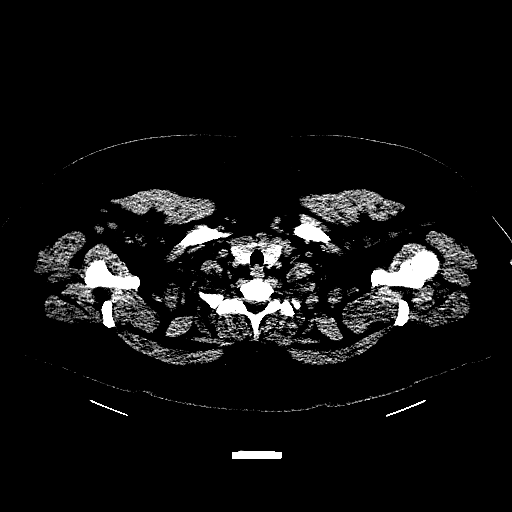
[im 285/314  brain]
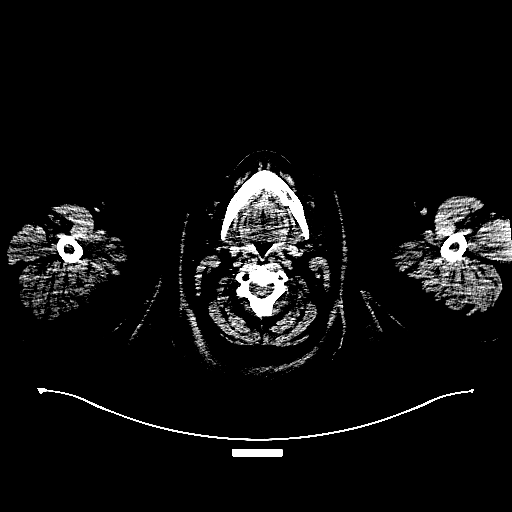
[im 314/314  brain]
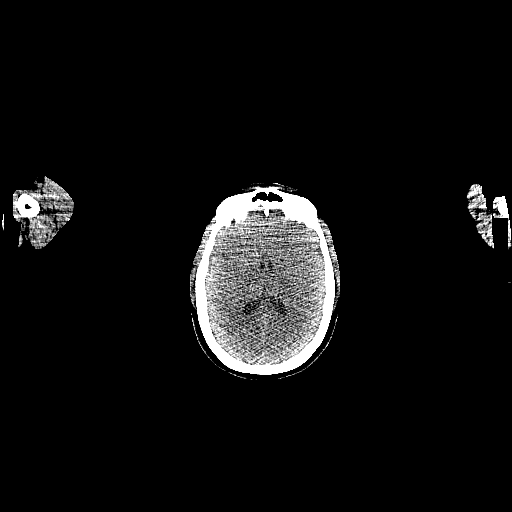

[pet/ct mip movie · axial · 1.0mm · 3.00mm/px · z∈[-24,+24]mm · 2 of 48 slices shown]
[im 1/48]
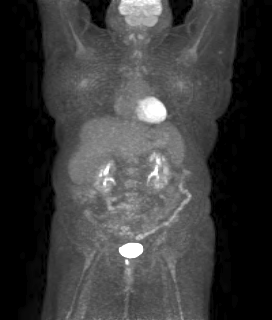
[im 48/48]
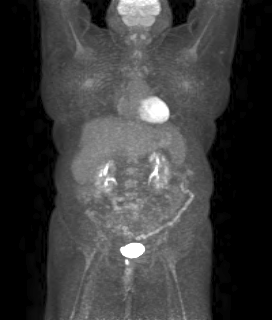

[25 of 25 positions shown; findings below may reference images not displayed]

FINDINGS: HEAD/NECK: 

PET: There is normal distribution of the radiopharmaceutical.

CT:  Metal dental artifact is seen within the mouth. Cervical spinal arthritis is demonstrated. Prominent subcutaneous fatty layer indicates generous body habitus.

THORAX: 

PET: The background mediastinal maximum SUV is 1.1. There is normal distribution of the radiopharmaceutical. No hypermetabolic mediastinal adenopathy is evident.

CT: Previous lower right lung airspace disease has resolved and may represent postinflammatory change or atelectasis. This is well aerated on the current exam. Prominent subcutaneous fatty layer indicates obese body habitus.

ABDOMEN/PELVIS:

PET:  The background liver mean SUV is  0.70. PERCIST Threshold is 1.22. Physiologic FDG activity is seen within the abdomen and pelvis.

CT: Obese body habitus is again seen. Umbilical hernia repair is evident. Gallbladder surgically absent.

SKELETAL:

PET: No malignant FDG activity is seen within the skeleton.

CT: Low-grade arthritis is noted in the cervical, upper thoracic spine, lower lumbar spine as well as joints of the shoulders and joints of the pelvis.
IMPRESSION: 1. Today's PET/CT is compared to [REDACTED] chest CTA dated 08/11/2017. Both lungs are currently well aerated with no residual airspace disease. Prior pleural effusions are resolved. Heart is upper limits of normal in size without pericardial effusion. No malignant FDG activity is seen on this PET/CT.

2.  Obese body habitus. Surgical changes from umbilical hernia repair are noted and gallbladder is surgically absent.

3. Low-grade arthritic changes in the spine, shoulders and joints of the pelvis. Remaining PET/CT appears unremarkable for age.

## 2019-12-04 HISTORY — PX: CARDIAC CATHETERIZATION: SHX172

## 2020-02-17 IMAGING — CT CT ABD/PEL W CONT
2 of 4 series · 17 of 46 positions shown, 19 images · IV contrast (agent unspecified)
Comparison: CT abdomen and pelvis 01/31/2015

HISTORY: Abdominal and pelvic pain, right-sided for 7 months.
TECHNIQUE: Axial CT images were obtained through abdomen and pelvis after the administration of IV contrast.  Coronal and sagittal reformatted images were generated from the original data set. Oral contrast was also administered.

CONTRAST: 100 ml of Usovue-Q55.

[Series 3: soft tissue · axial · 0.97mm/px · z∈[-526,-72]mm · 14 of 101 slices shown, 16 images]
[im 5/101  soft-tissue]
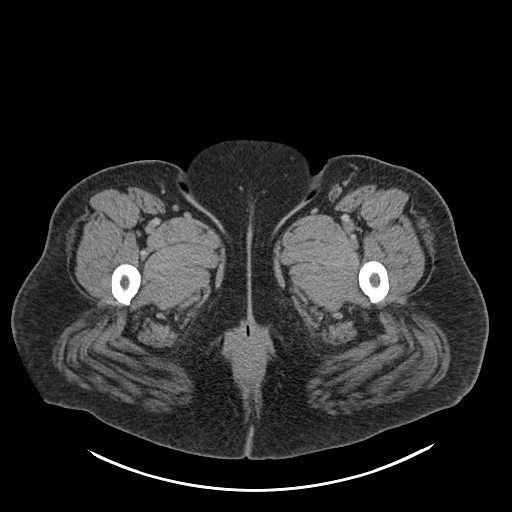
[im 5/101  bone]
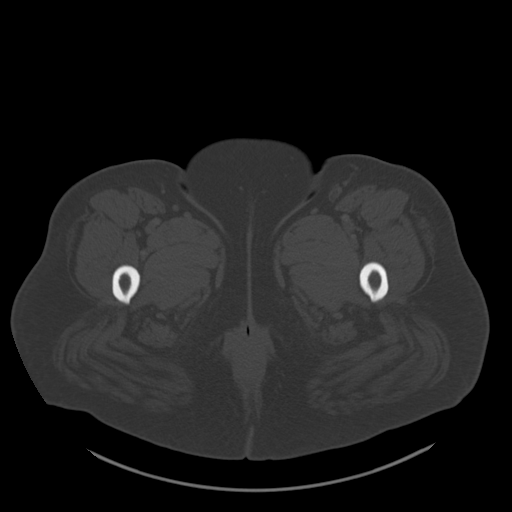
[im 13/101  soft-tissue]
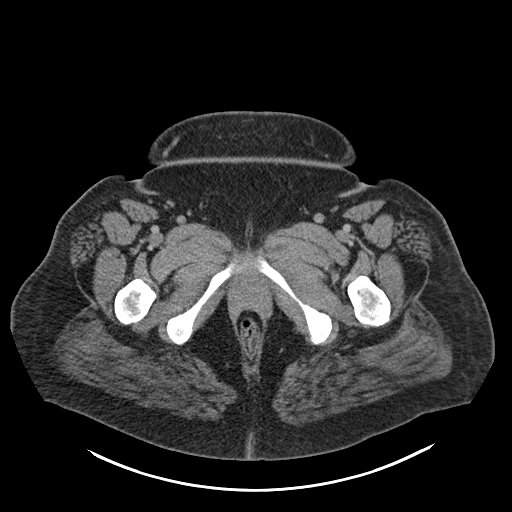
[im 21/101  soft-tissue]
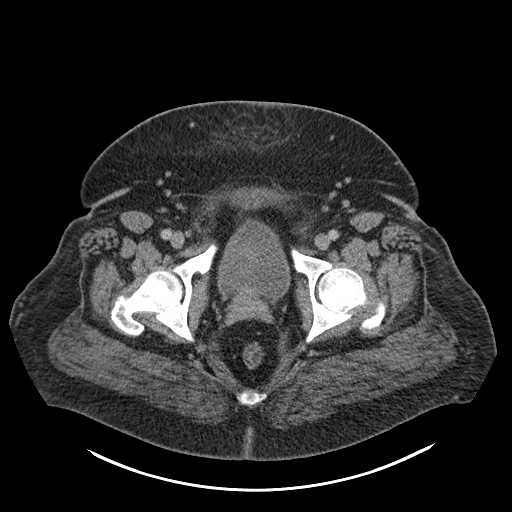
[im 26/101  soft-tissue]
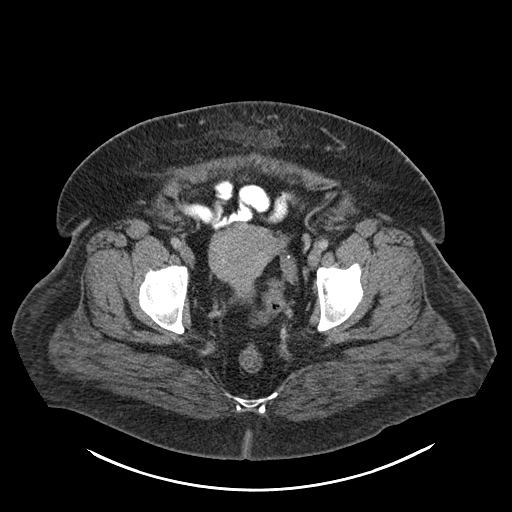
[im 34/101  soft-tissue]
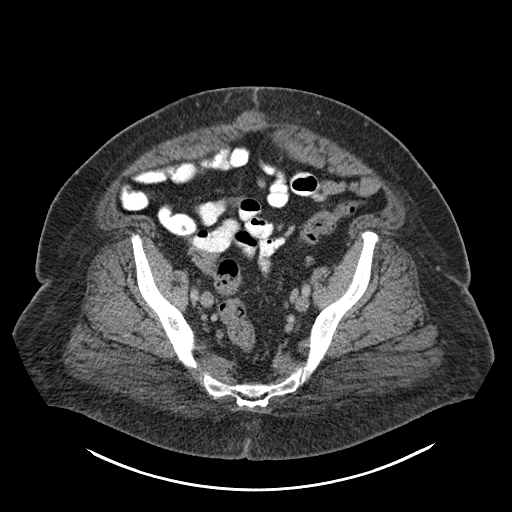
[im 42/101  soft-tissue]
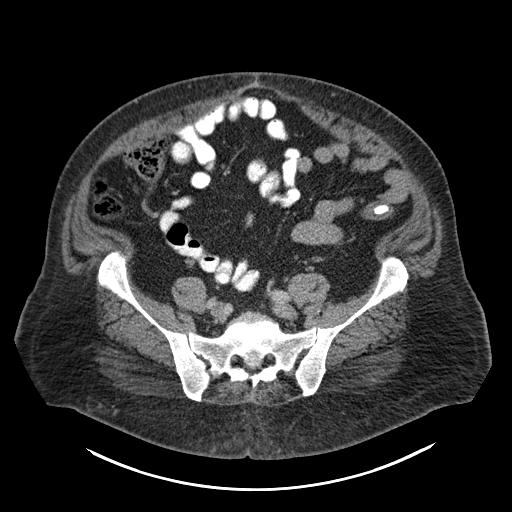
[im 46/101  soft-tissue]
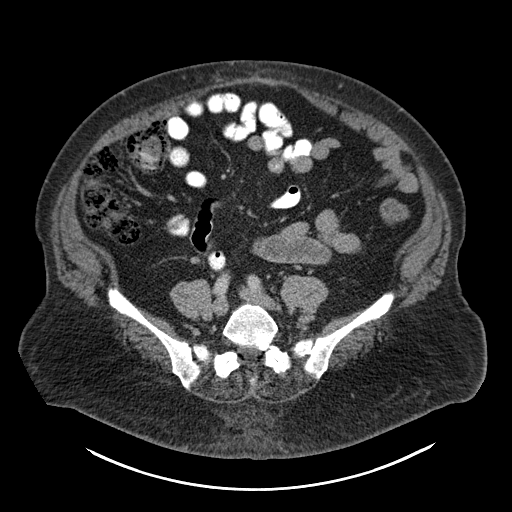
[im 55/101  soft-tissue]
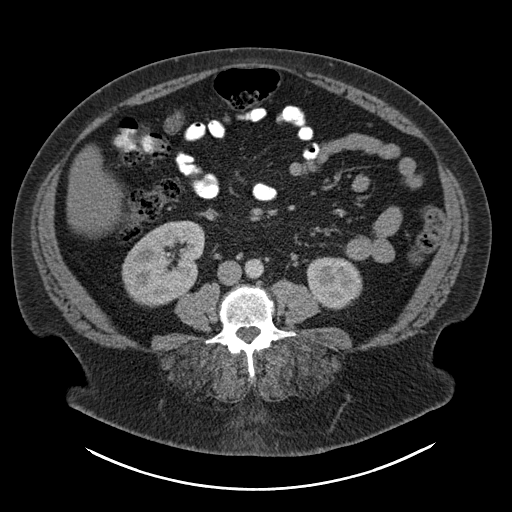
[im 59/101  soft-tissue]
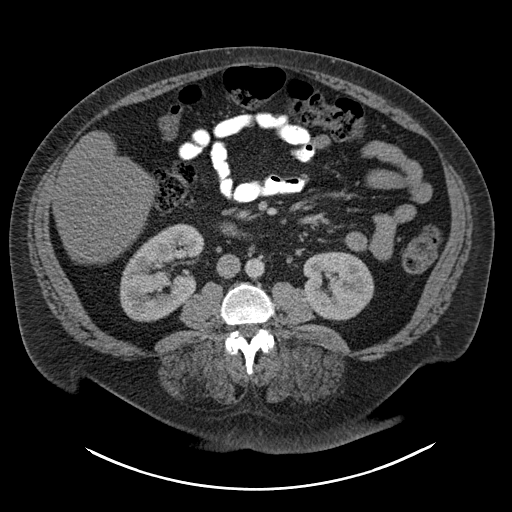
[im 59/101  bone]
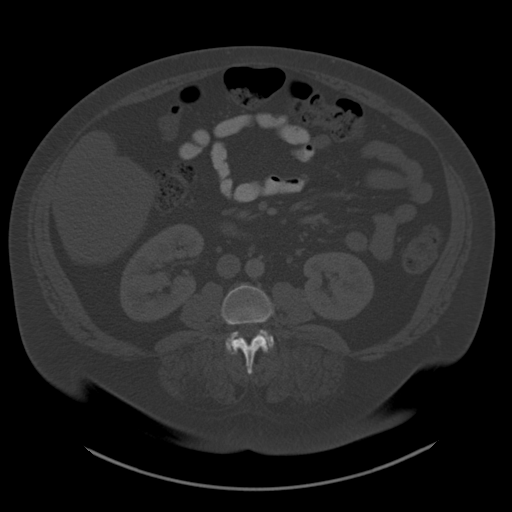
[im 67/101  soft-tissue]
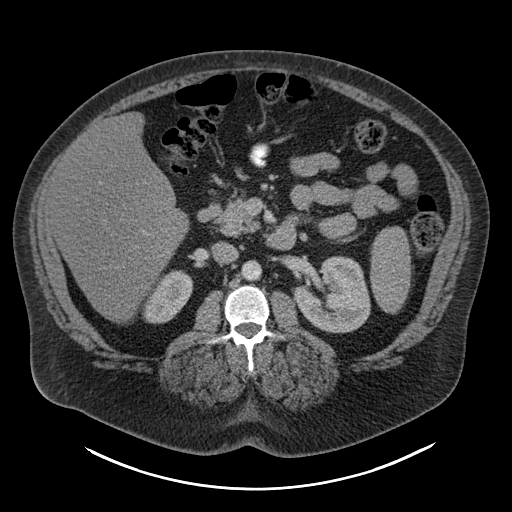
[im 76/101  soft-tissue]
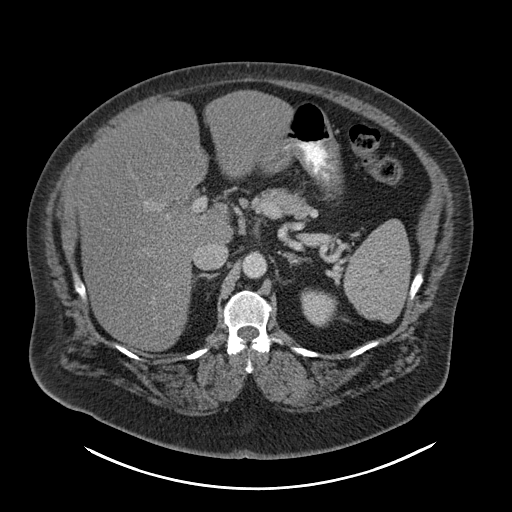
[im 80/101  soft-tissue]
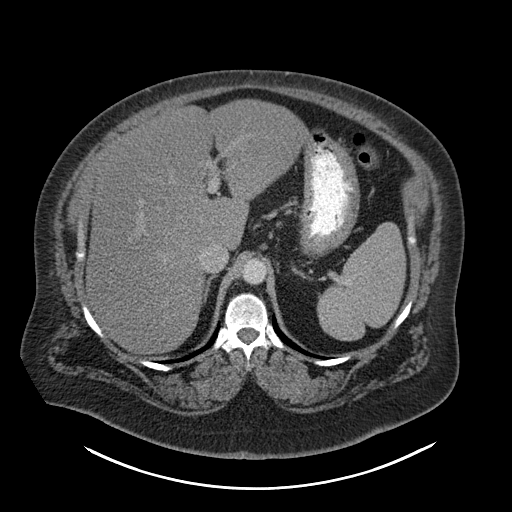
[im 88/101  soft-tissue]
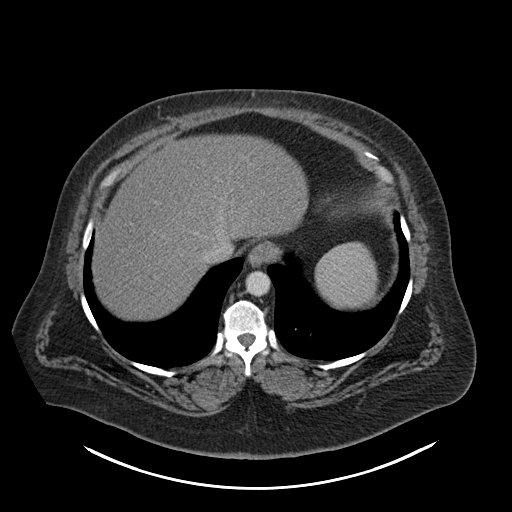
[im 96/101  soft-tissue]
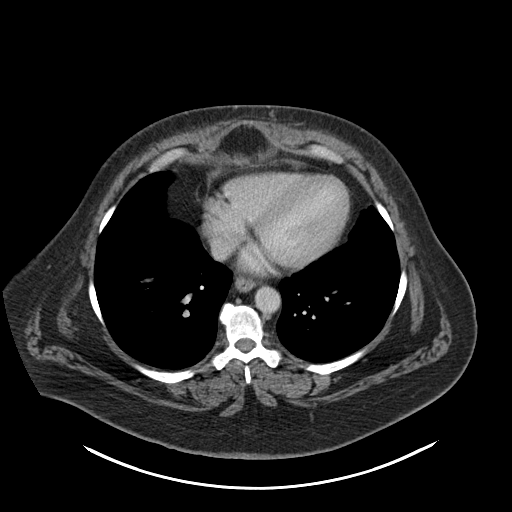

[Series 5: coronal · coronal · 0.97mm/px · 3 of 73 slices shown]
[im 25/73  soft-tissue]
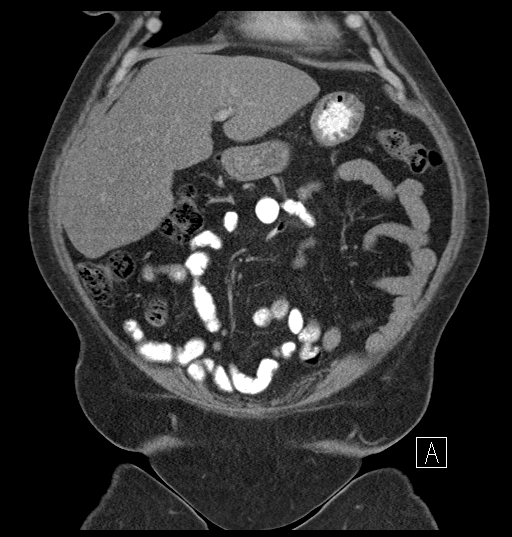
[im 33/73  soft-tissue]
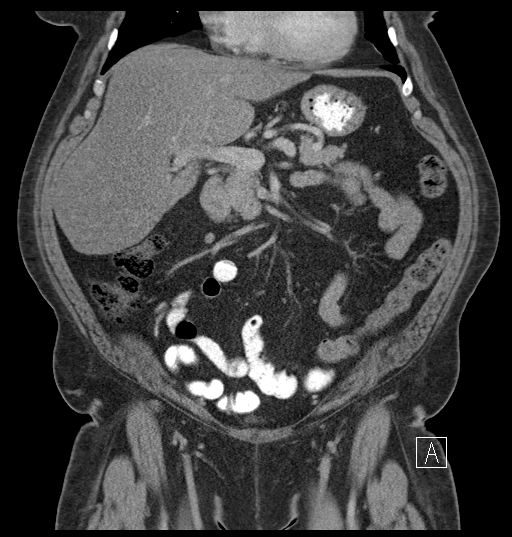
[im 41/73  soft-tissue]
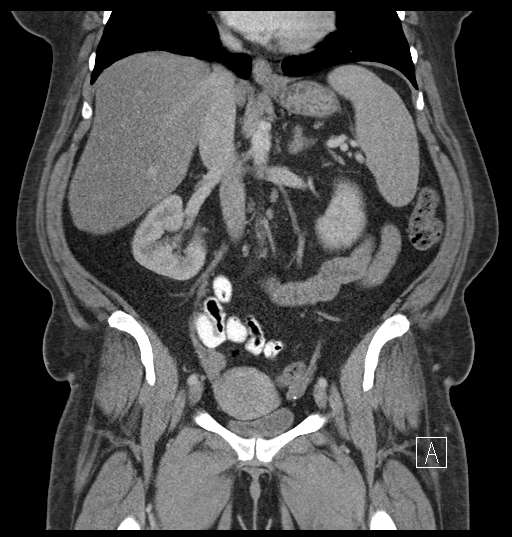

[17 of 46 positions shown; findings below may reference images not displayed]

FINDINGS: LUNGS BASES: There is some prominent epicardial fat noted.

HEPATOBILIARY: The liver is enlarged measuring 22 cm in length. It has a decreased density suggesting hepatic steatosis. Patient is status post cholecystectomy. No biliary ductal dilatation is seen.

SPLEEN: Normal.

PANCREAS: Normal.

ADRENAL GLANDS: Normal.

KIDNEYS: Normal.

RETROPERITONEUM: Normal.

BOWEL: Bowel loops have a normal caliber. Appendix is visualized and appears normal.

PELVIS: Bladder, uterus and adnexa are unremarkable. No free fluid is seen.

OSSEOUS STRUCTURES: Moderate facet arthrosis is seen L3-L4 through L5-S1 levels. Moderate degenerative disc type changes are also seen at L5-S1 with a prominent left posterior osteophyte extending into the region of the left L5-S1 neural foramen.

OTHER: There are postsurgical changes in the anterior abdominal wall.
IMPRESSION: 1.
No acute findings.

2.
Hepatomegaly and hepatic steatosis.

3.
Status post cholecystectomy.

4.
Moderate degenerative disc type changes L5-S1 with a prominent left posterior osteophyte extending into the region of the neural foramen at that level. Consider follow-up MRI lumbar spine for additional imaging evaluation.

Total radiation dose to patient is CTDIvol 41.93 mGy and DLP 1287.15 mGy-cm.

## 2020-02-17 IMAGING — MG MAMMO DIAG BIL W/CAD TOMO
6 of 9 series · 6 of 25 positions shown · non-contrast
Comparison: The present examination has been compared to prior imaging studies.

HISTORY: Patient is 48 years old and is seen for diagnostic evaluation of a lump in the lateral region and in the axilla region and pain in the right breast in the lateral region and in the axilla region. The patient has a history of right excisional biopsy more than 10 years ago - benign. The patient has no personal history of cancer. The patient does not have a first degree relative with breast cancer.
TECHNIQUE: Bilateral 2-D digital diagnostic mammogram was performed followed by 3-D tomosynthesis. Real-time targeted ultrasound of the right breast was performed.  Current study was also evaluated with a computer aided detection (CAD) system.

[R MLO (1 of 2)]
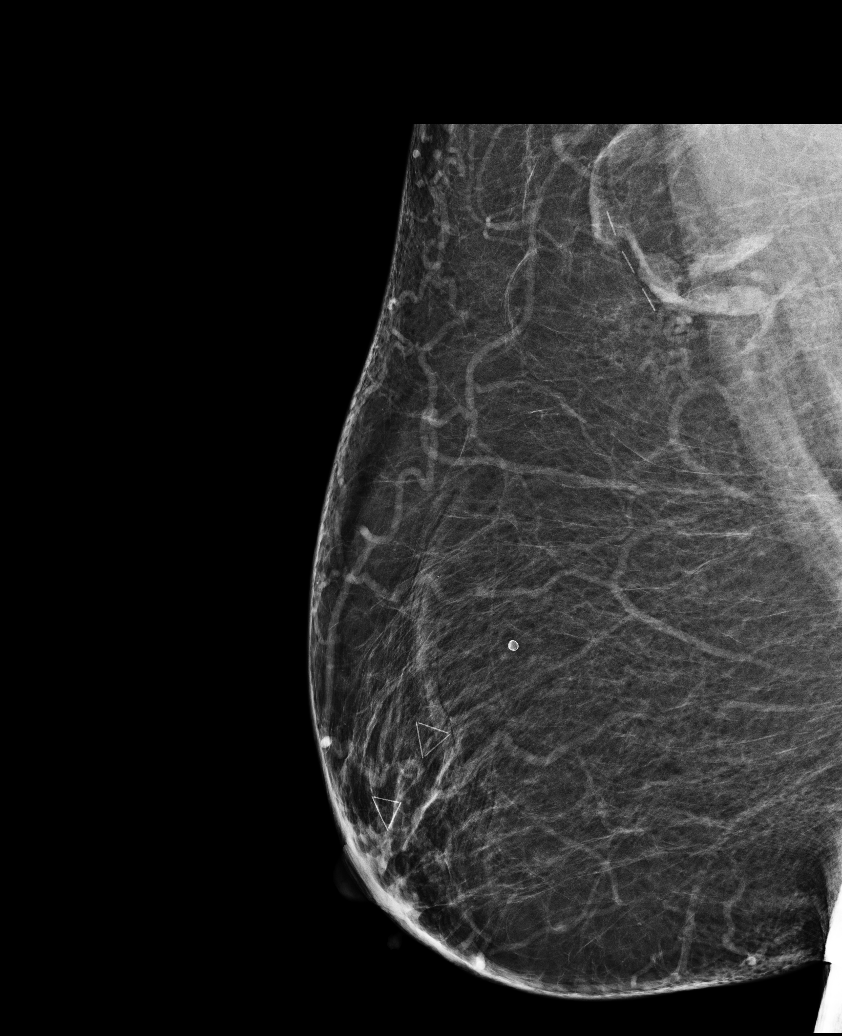

[R MLO (2 of 2)]
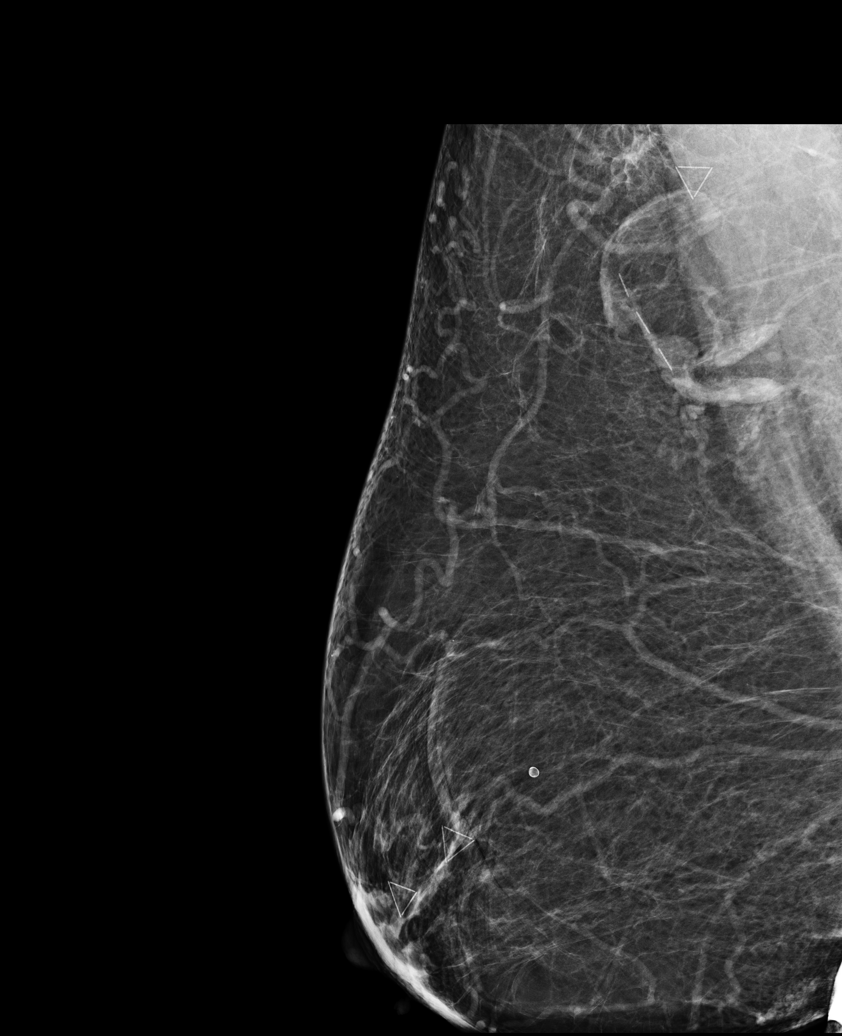

[L CC]
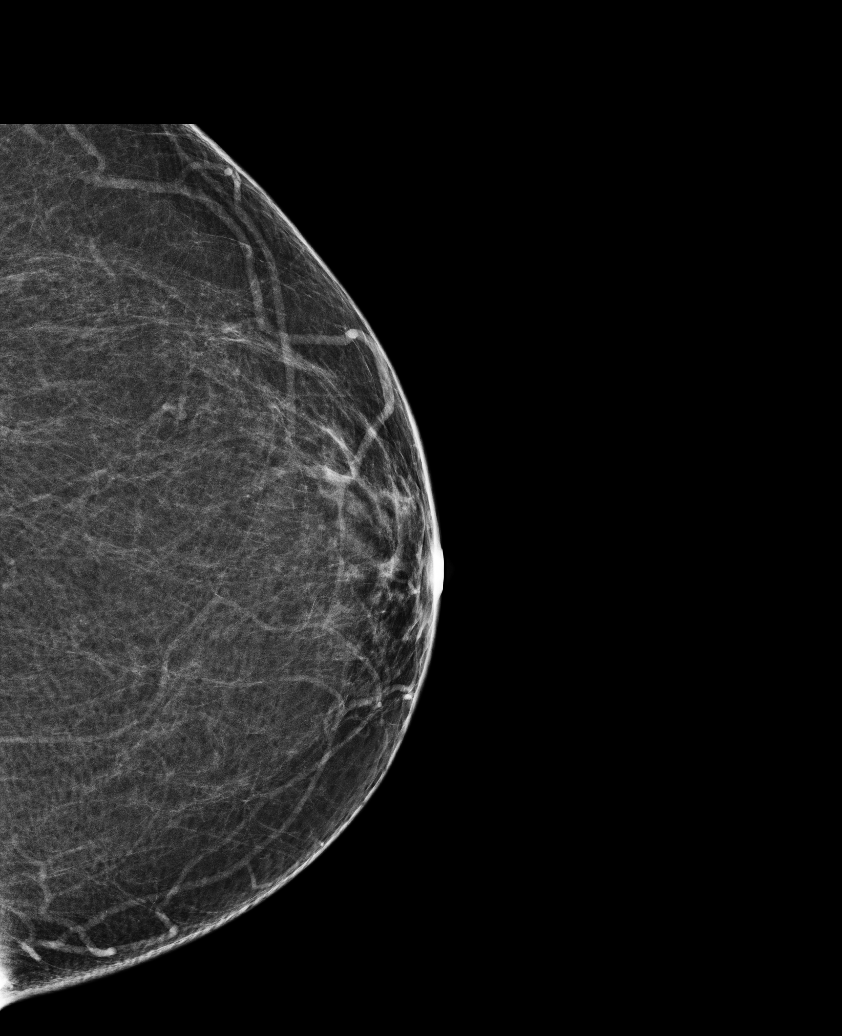

[R CC]
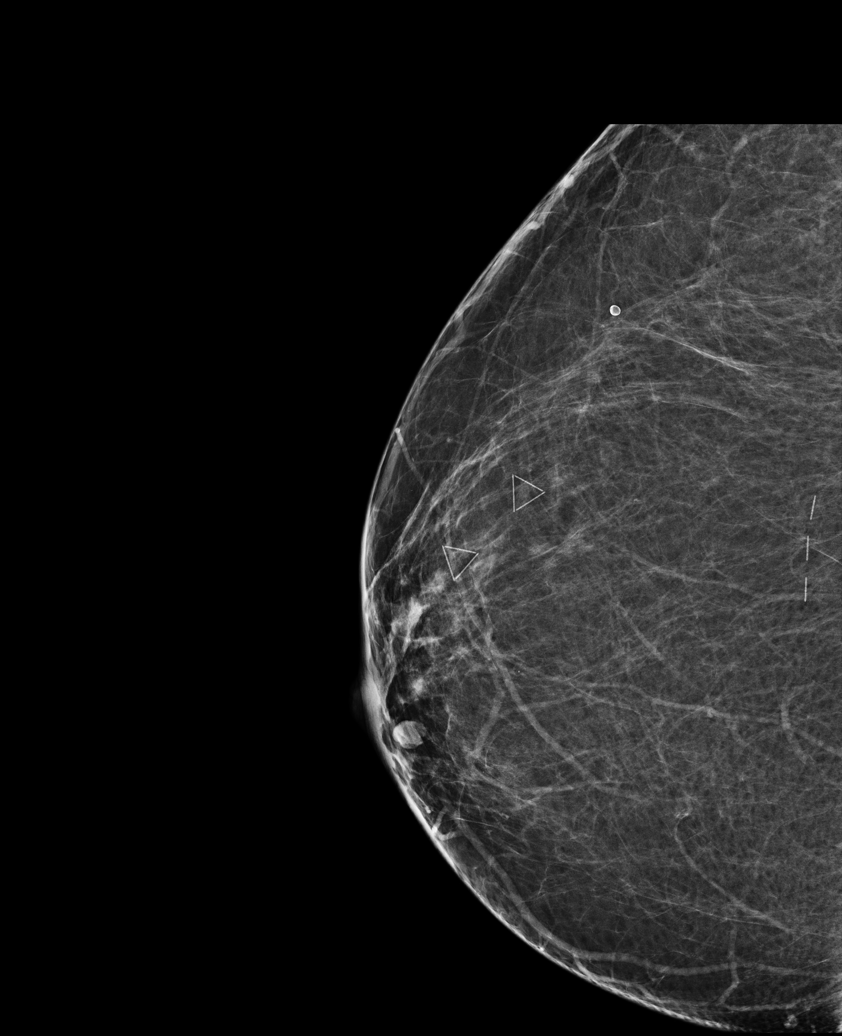

[L MLO]
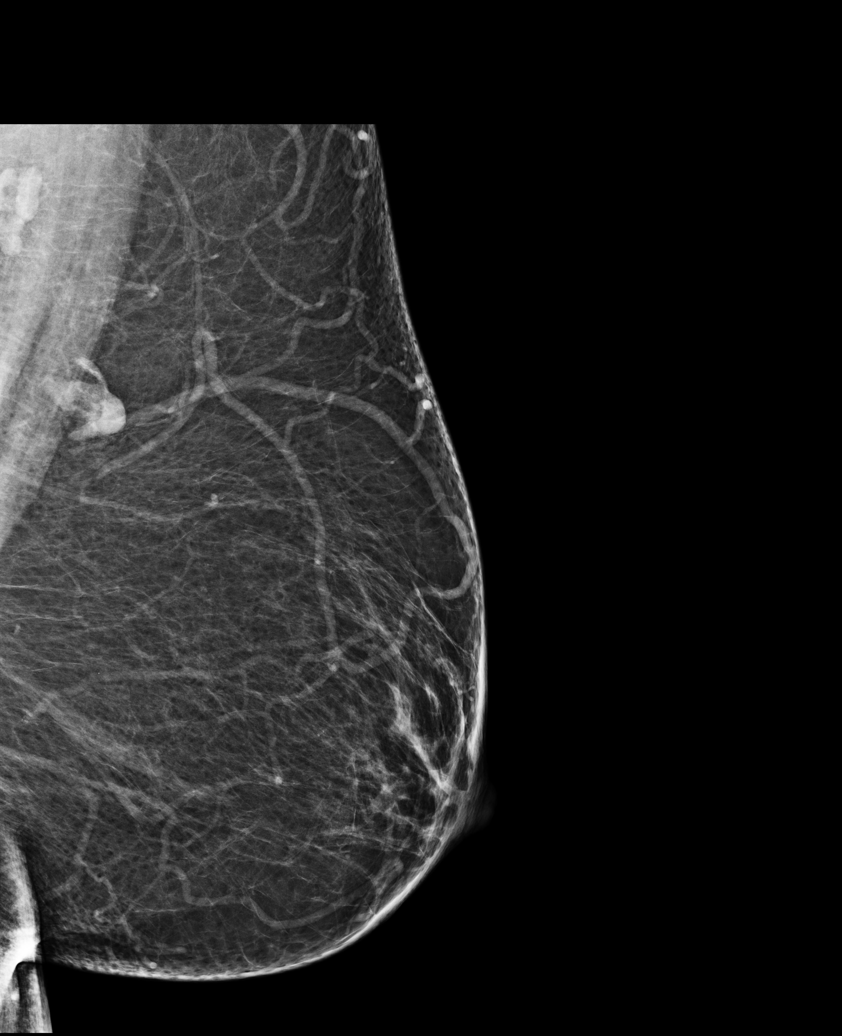

[R MLO tomo · tomo slice 41/82.0]
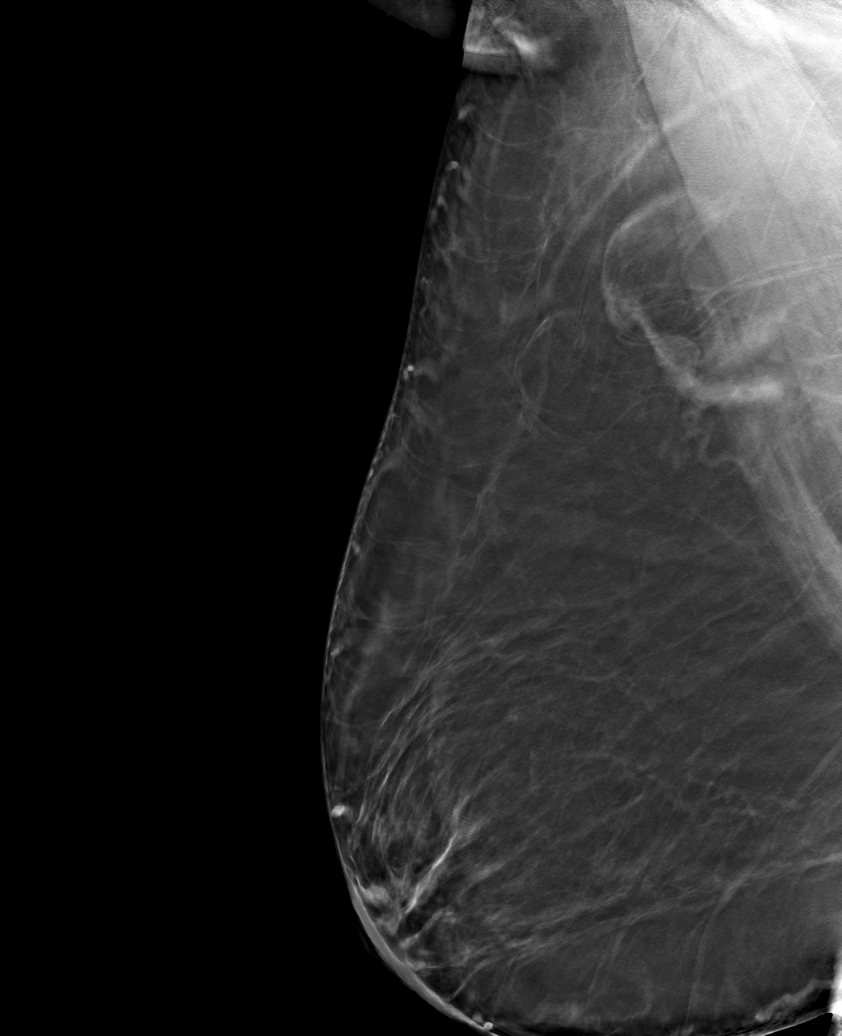

[6 of 25 positions shown; findings below may reference images not displayed]

MAMMOGRAM FINDINGS:

The breasts are almost entirely fatty. There are no suspicious masses, calcifications, or distortions seen in either breast. Incidentally, no mammographic correlate to the palpable lumps in the right breast at 9 o'clock and in the right axilla. There are benign-appearing stable lymph nodes in the bilateral axilla. 

ULTRASOUND FINDINGS:

Targeted ultrasound was performed in the areas of palpable lump. In the right breast at 9 o'clock, close to the nipple, there is an intradermal, avascular lesion measuring 16 x 2 x 12 mm corresponding to the palpable lump adjacent to the nipple. In the right axilla, there is a second intradermal, avascular cyst measuring 15 x 4 x 8 mm corresponding to the palpable lump in the right axilla. This is associated with mild edema the deeper subcutaneous fat. No solid breast masses or other suspicious sonographic findings identified.
IMPRESSION: Palpable lumps in the right breast at 9 o'clock and in the right axilla correspond to intradermal lesions, most likely inflamed sebaceous cyst/dermal inclusion cyst. Recommend clinical correlation and follow-up to ensure resolution. Dermatologic consultation advised if clinically indicated.

No mammographic or sonographic evidence for breast malignancy. A return to screening in 1 year is recommended.

Findings and recommendations discussed with the patient. The patient received a copy of the results at the end of the examination.

BI-RADS Category 2: Benign

## 2020-02-17 IMAGING — US US BREAST RT LTD
1 series · 13 of 16 positions shown · non-contrast
Comparison: The present examination has been compared to prior imaging studies.

HISTORY: Patient is 48 years old and is seen for diagnostic evaluation of a lump in the lateral region and in the axilla region and pain in the right breast in the lateral region and in the axilla region. The patient has a history of right excisional biopsy more than 10 years ago - benign. The patient has no personal history of cancer. The patient does not have a first degree relative with breast cancer.
TECHNIQUE: Bilateral 2-D digital diagnostic mammogram was performed followed by 3-D tomosynthesis. Real-time targeted ultrasound of the right breast was performed.  Current study was also evaluated with a computer aided detection (CAD) system.

[Series 1: us breast right ltd · 13 of 16 slices shown]
[im 1/16]
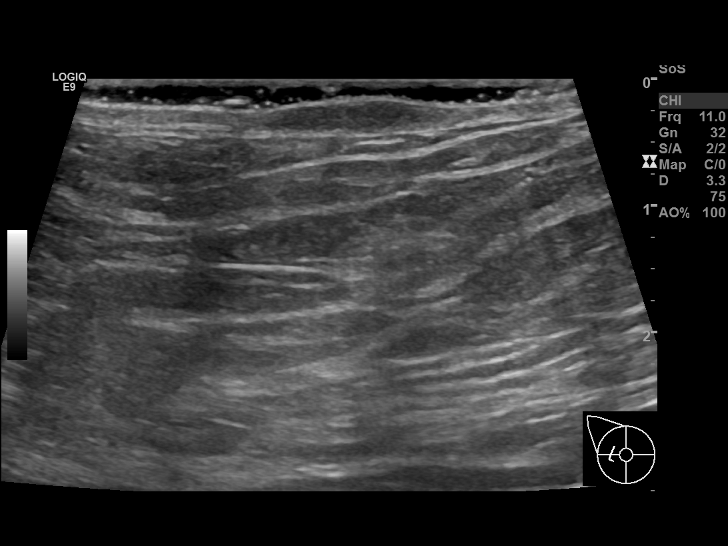
[im 2/16]
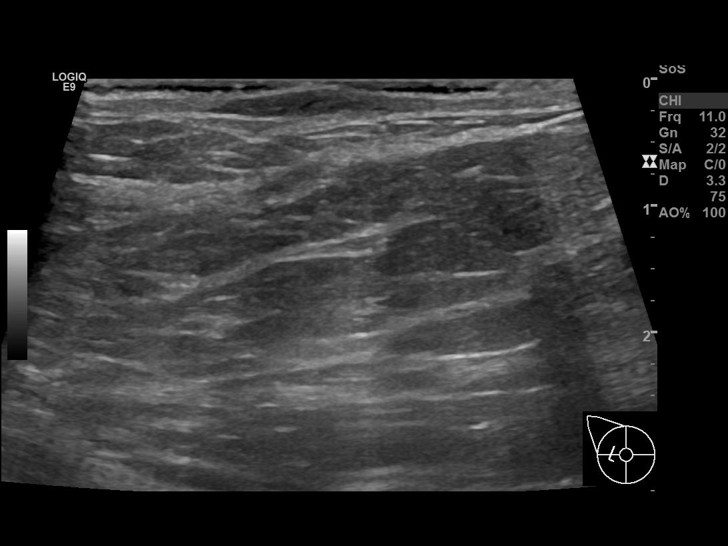
[im 4/16]
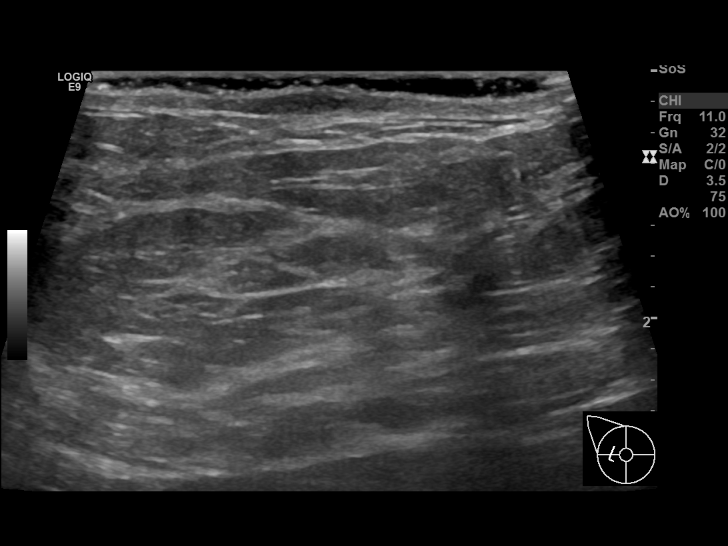
[im 5/16]
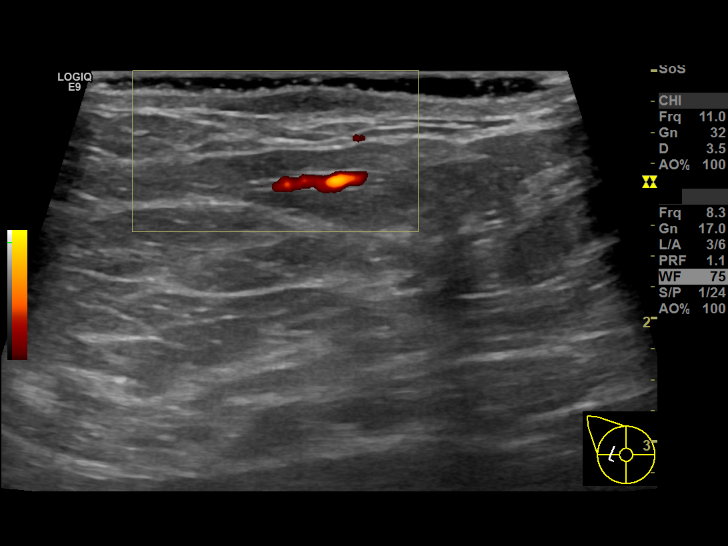
[im 6/16]
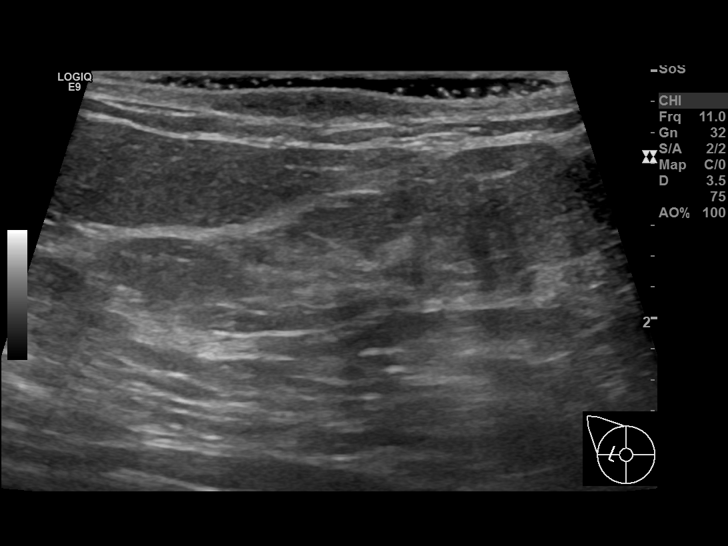
[im 7/16]
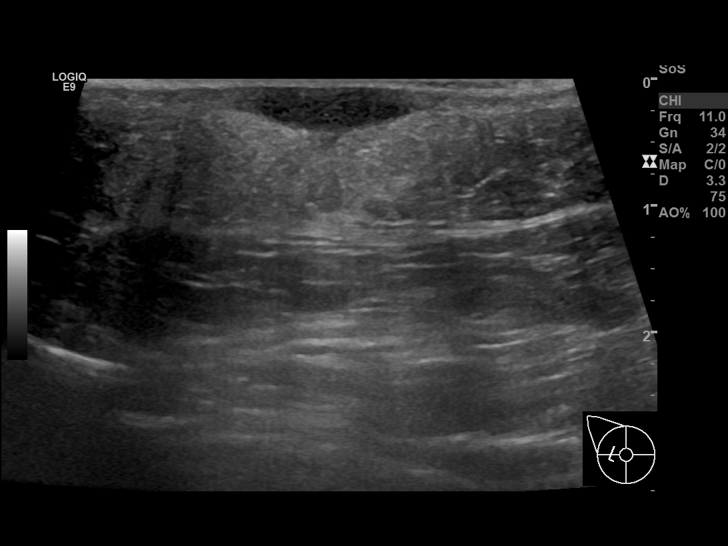
[im 9/16]
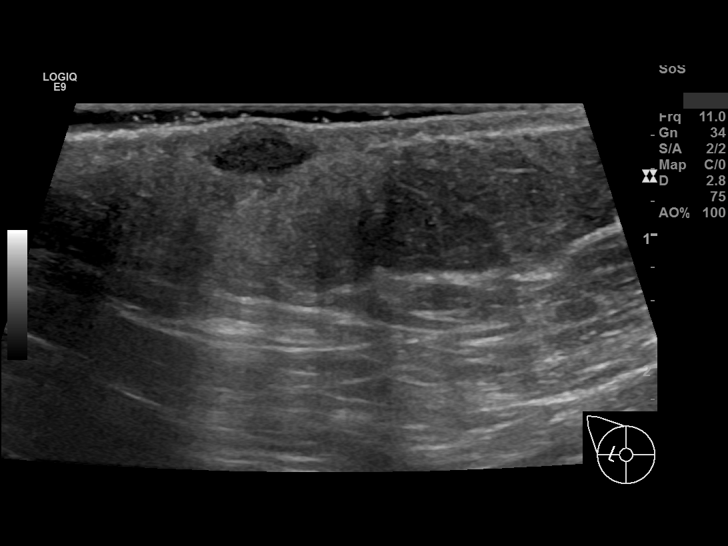
[im 10/16]
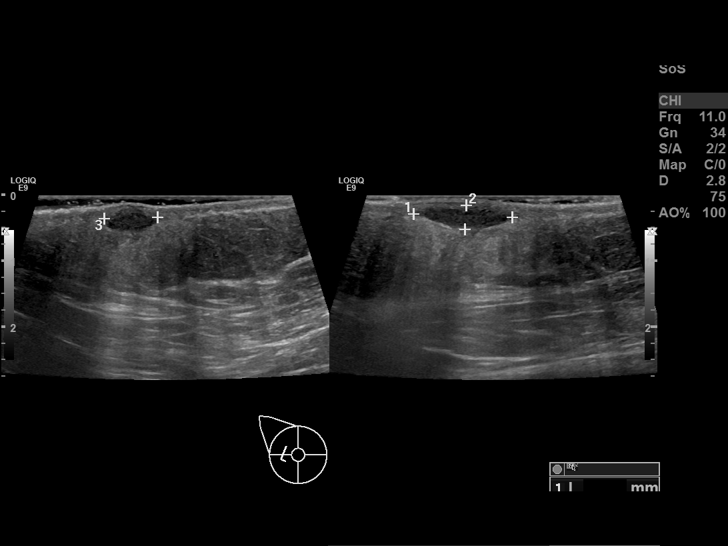
[im 11/16]
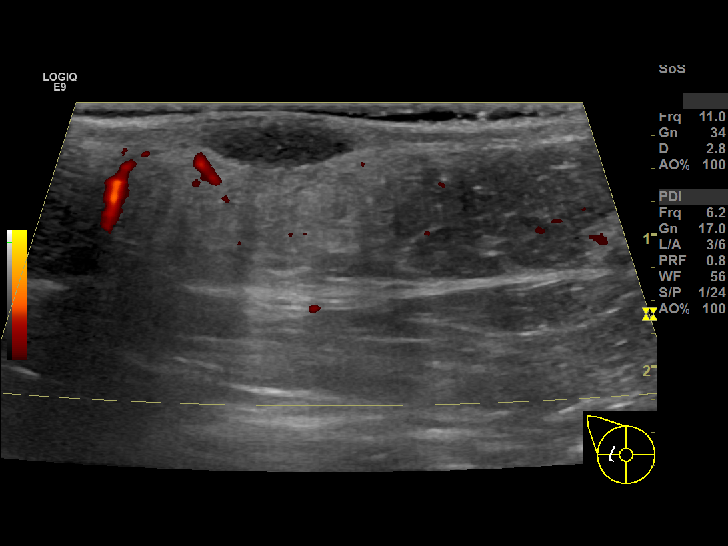
[im 12/16]
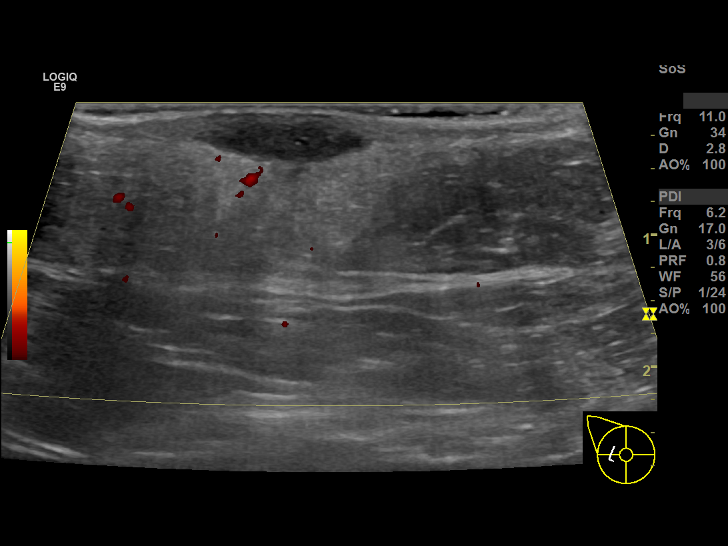
[im 13/16]
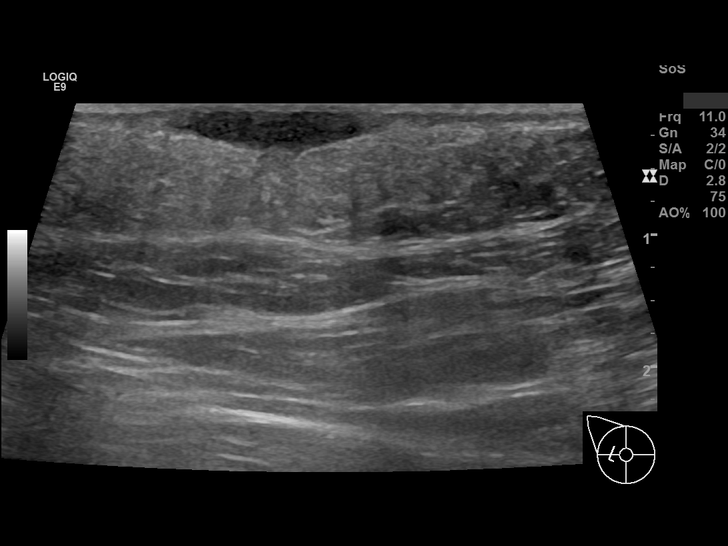
[im 15/16]
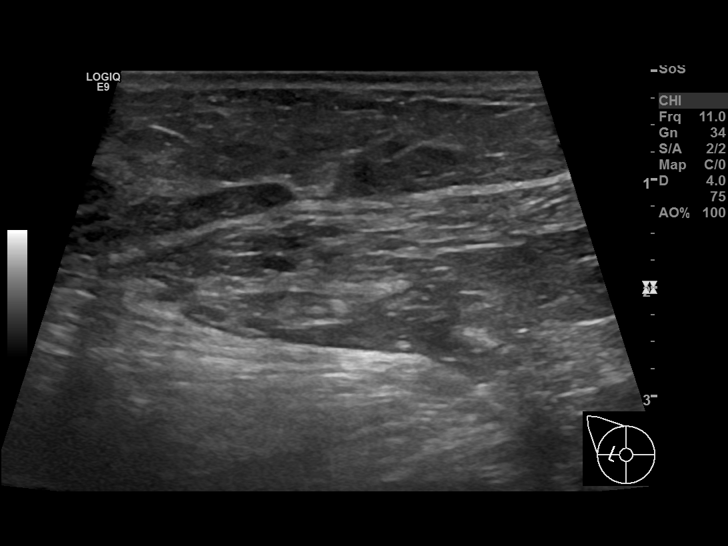
[im 16/16]
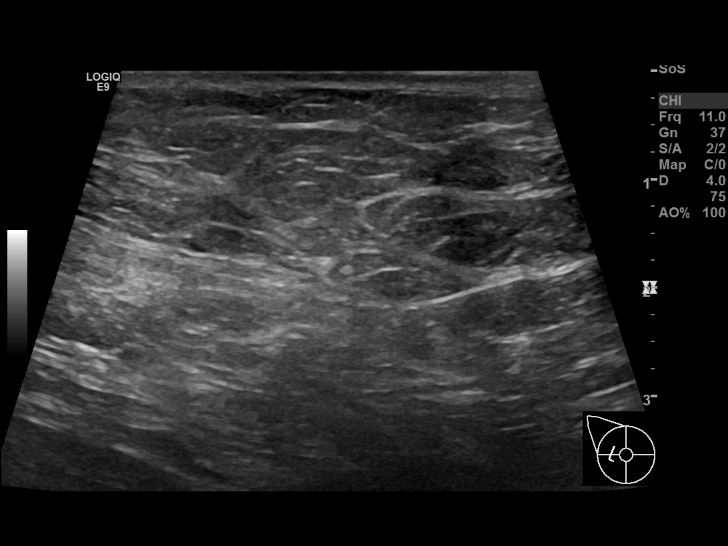

[13 of 16 positions shown; findings below may reference images not displayed]

MAMMOGRAM FINDINGS:

The breasts are almost entirely fatty. There are no suspicious masses, calcifications, or distortions seen in either breast. Incidentally, no mammographic correlate to the palpable lumps in the right breast at 9 o'clock and in the right axilla. There are benign-appearing stable lymph nodes in the bilateral axilla. 

ULTRASOUND FINDINGS:

Targeted ultrasound was performed in the areas of palpable lump. In the right breast at 9 o'clock, close to the nipple, there is an intradermal, avascular lesion measuring 16 x 2 x 12 mm corresponding to the palpable lump adjacent to the nipple. In the right axilla, there is a second intradermal, avascular cyst measuring 15 x 4 x 8 mm corresponding to the palpable lump in the right axilla. This is associated with mild edema the deeper subcutaneous fat. No solid breast masses or other suspicious sonographic findings identified.
IMPRESSION: Palpable lumps in the right breast at 9 o'clock and in the right axilla correspond to intradermal lesions, most likely inflamed sebaceous cyst/dermal inclusion cyst. Recommend clinical correlation and follow-up to ensure resolution. Dermatologic consultation advised if clinically indicated.

No mammographic or sonographic evidence for breast malignancy. A return to screening in 1 year is recommended.

Findings and recommendations discussed with the patient. The patient received a copy of the results at the end of the examination.

BI-RADS Category 2: Benign

## 2020-08-18 IMAGING — CT CT ABD/PEL W CONT
2 of 4 series · 16 of 46 positions shown, 18 images · non-contrast
Comparison: 02/17/2020.

HISTORY: Right upper quadrant abdominal pain.
TECHNIQUE: Scans of the abdomen and pelvis are performed at 5 mm increments, following administration of oral contrast and IV administration of 100 mL of Ksovue-F00 with coronal and sagittal reconstructions. I-STAT creatinine is 0.5 mg/dL.

Total radiation dose to patient is CTDIvol 41.21 mGy and DLP 1143.47 mGy-cm.

[Series 3: soft tissue · axial · 0.97mm/px · z∈[-754,-344]mm · 13 of 90 slices shown, 15 images]
[im 4/90  soft-tissue]
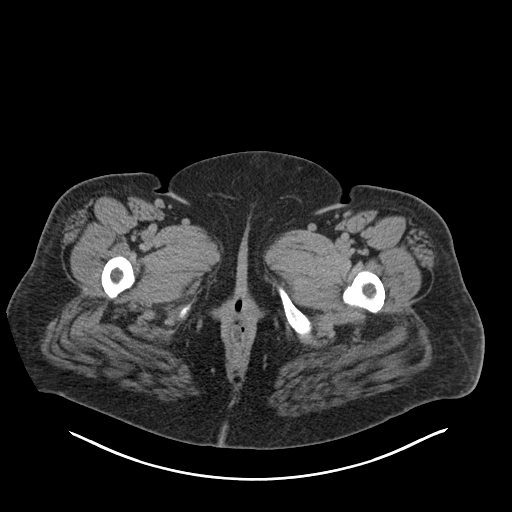
[im 4/90  bone]
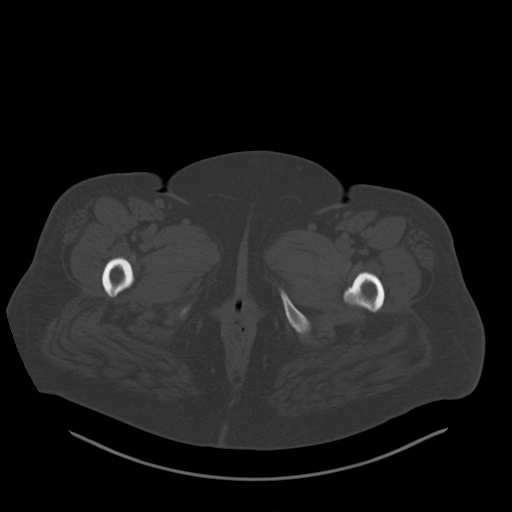
[im 11/90  soft-tissue]
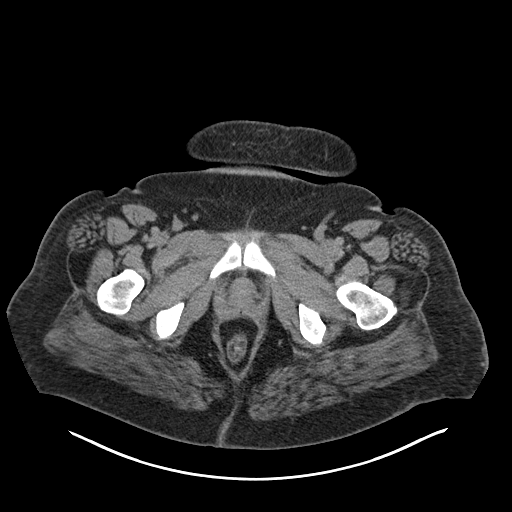
[im 18/90  soft-tissue]
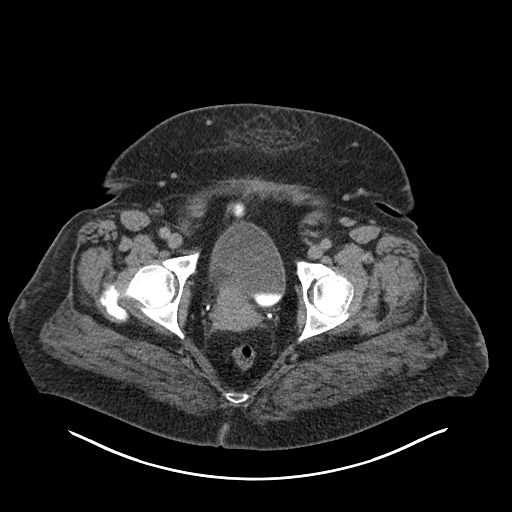
[im 25/90  soft-tissue]
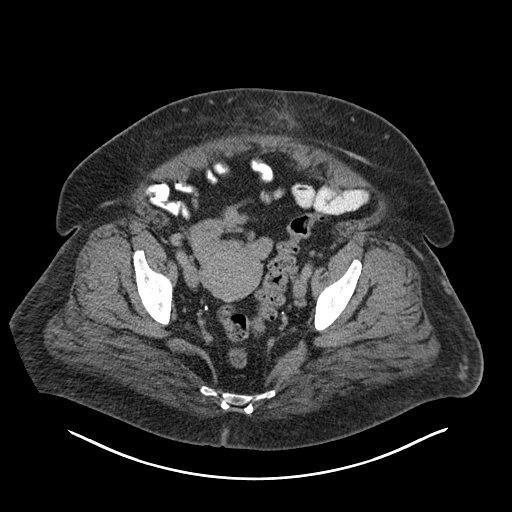
[im 33/90  soft-tissue]
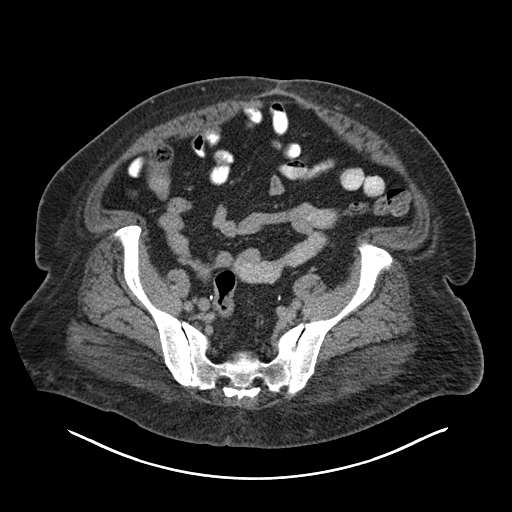
[im 40/90  soft-tissue]
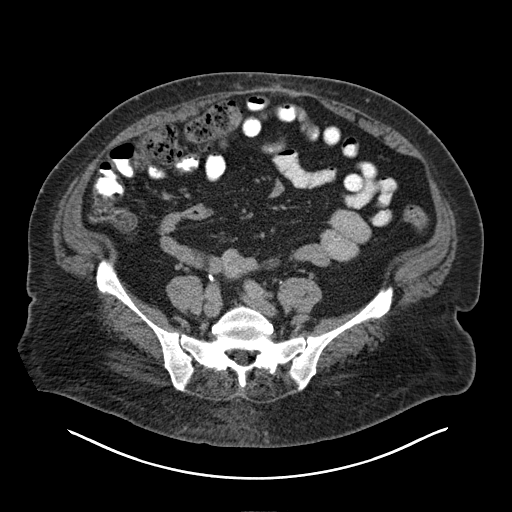
[im 47/90  soft-tissue]
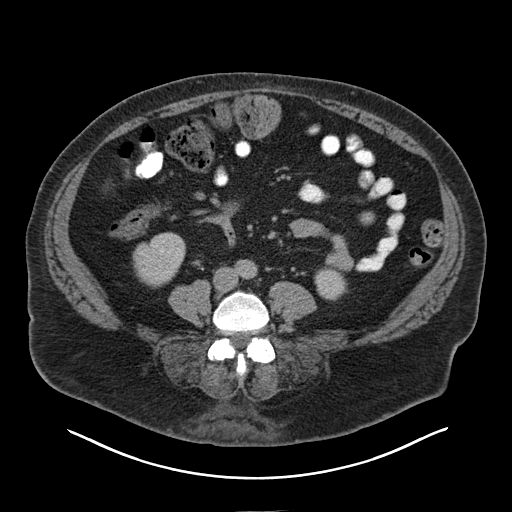
[im 50/90  soft-tissue]
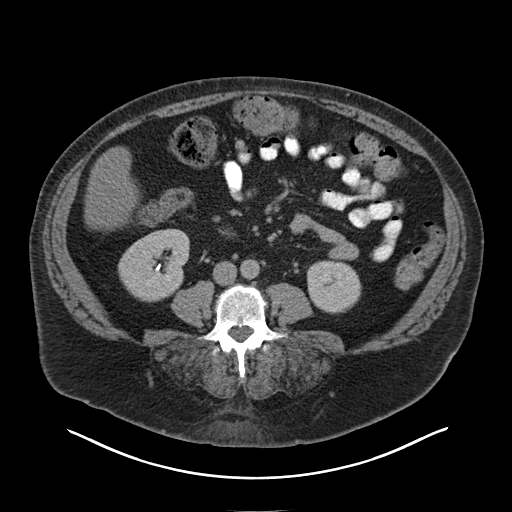
[im 57/90  soft-tissue]
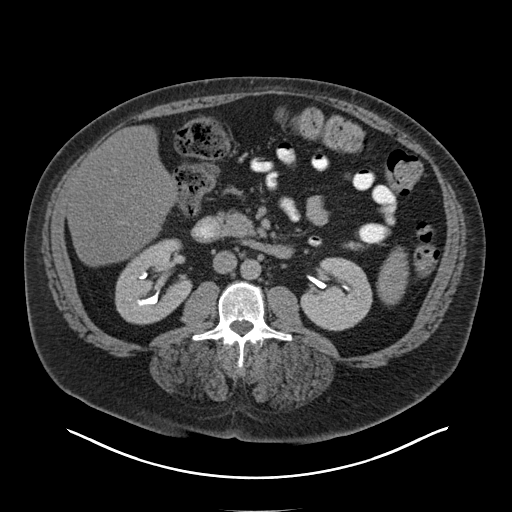
[im 57/90  bone]
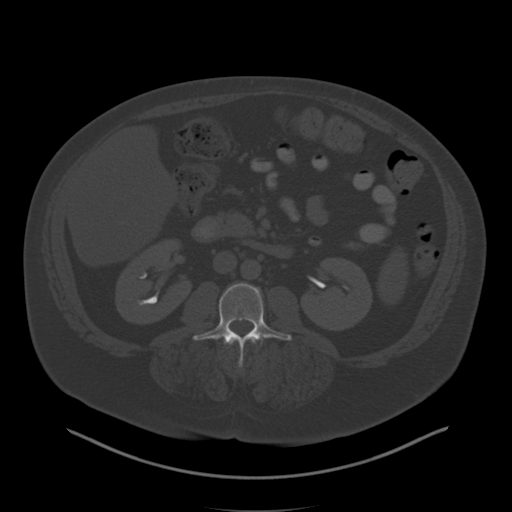
[im 65/90  soft-tissue]
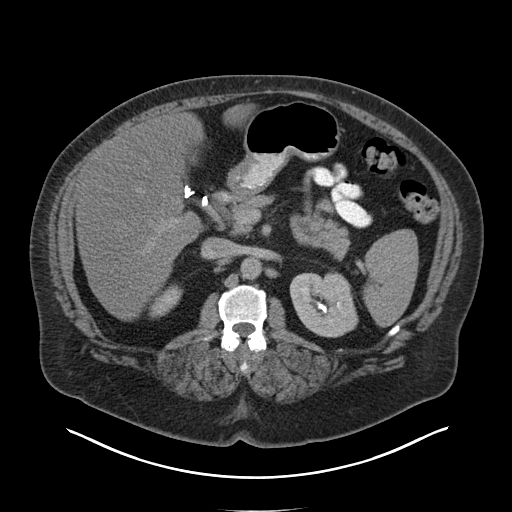
[im 72/90  soft-tissue]
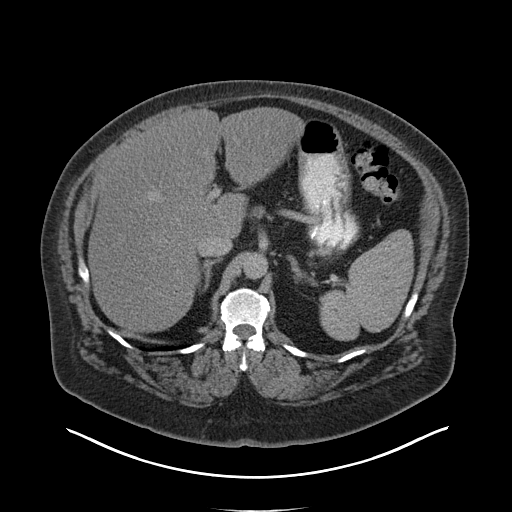
[im 79/90  soft-tissue]
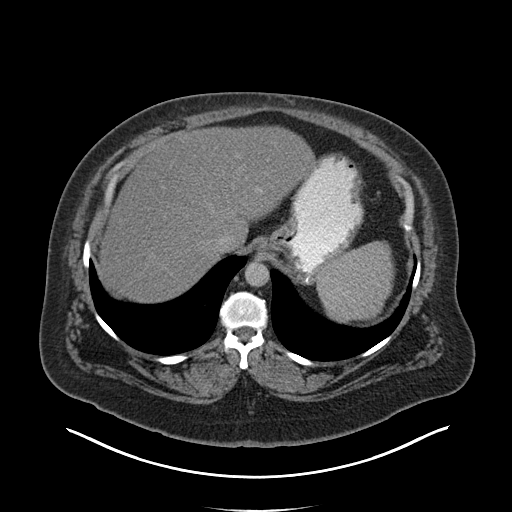
[im 86/90  soft-tissue]
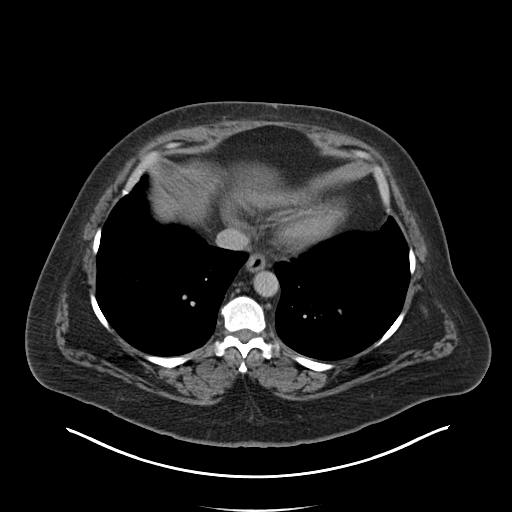

[Series 5: coronal · coronal · 0.89mm/px · 3 of 71 slices shown]
[im 24/71  soft-tissue]
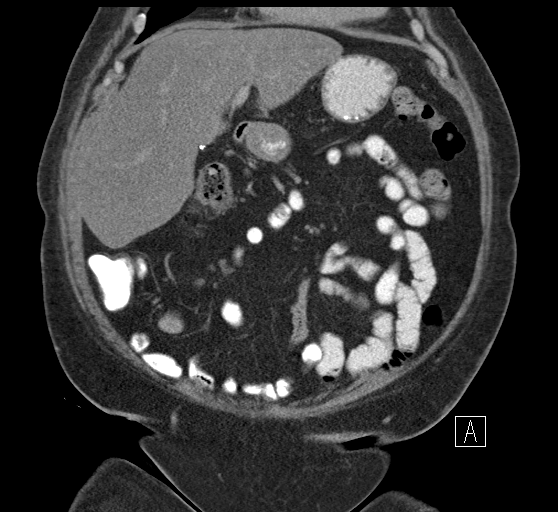
[im 32/71  soft-tissue]
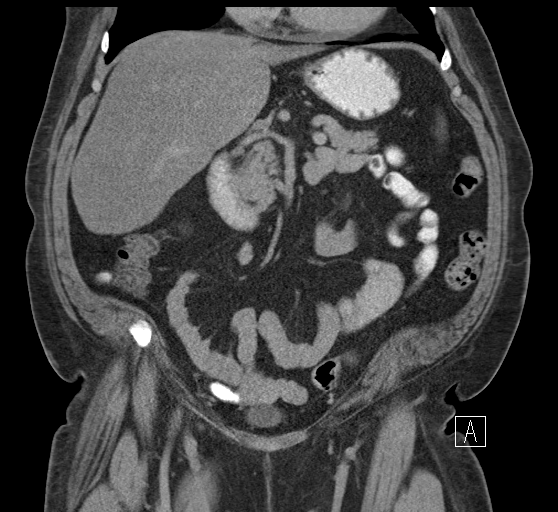
[im 39/71  soft-tissue]
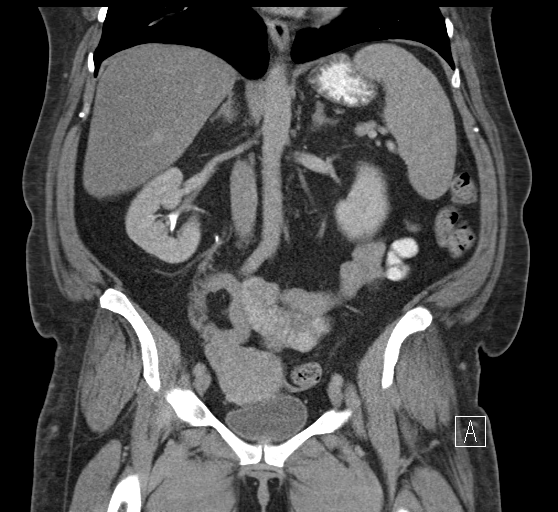

[16 of 46 positions shown; findings below may reference images not displayed]

FINDINGS: CT ABDOMEN: Lung bases are clear. Caudal margin of heart is normal. No hiatal hernia is present. The liver is enlarged, measuring 22 cm in long axis dimension, with mild diffuse fatty infiltration of liver without cirrhotic change or mass. Gallbladder surgically absent. Common bile duct is nondilated. Spleen, pancreas, adrenal glands, kidneys and retroperitoneum are normal. No intraperitoneal findings in upper or mid abdomen are seen. There are postsurgical changes of hernia repair using mesh, with small residual hernia occurring at the surgical repair site, unchanged from 02/17/2020 exam.

CT PELVIS: The right colon is normal. Appendix is not visualized. The remainder of colon is normal, excepting for moderate constipation and a few sigmoid diverticula. Atrophic uterus and adnexal structures are present. Distal ureters and bladder are normal. No inguinal findings are present. No significant bone findings are seen, except for stable large left paracentral L5-S1 spur which compromises the exiting left L5 nerve root.
IMPRESSION: 1. Hepatomegaly with mild diffuse fatty infiltration of liver, which is similar to 02/17/2020 exam.

2. Postop cholecystectomy.

3. Postop abdominal wall hernia repair at umbilicus with minimal stable residual hernia.

4. Minimal diverticulosis limited to sigmoid colon without diverticulitis.

5. Large paracentral spur in left L5-S1 neural foramen compressing exiting L5 nerve root.

## 2020-09-15 IMAGING — US US SOFT TISSUE NECK/THYROID
1 series · 14 of 25 positions shown · non-contrast
Comparison: Previous thyroid ultrasound 08/30/2014.

HISTORY: 49-year-old female, thyroid and neck mass, right submandibular area.
TECHNIQUE: Thyroid ultrasound and neck ultrasound.

[Series 1: us soft tissue neck/thyroid · 44 acquisitions, 14 frames shown]
[im 1/44]
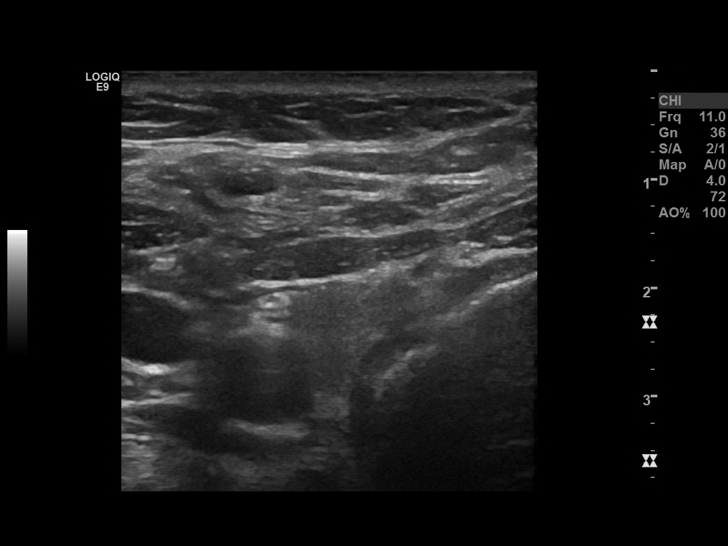
[im 4/44]
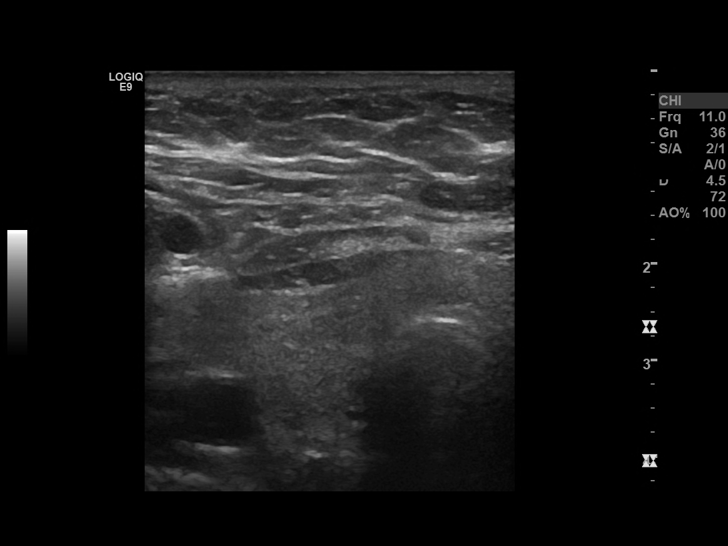
[im 8/44]
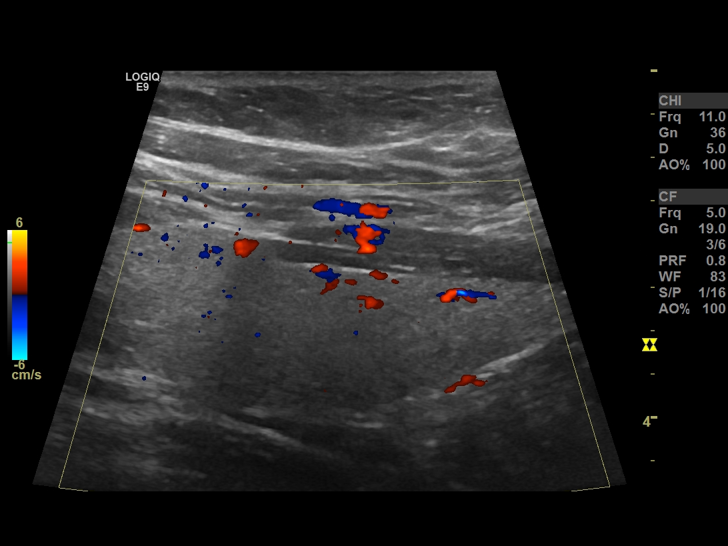
[im 11/44]
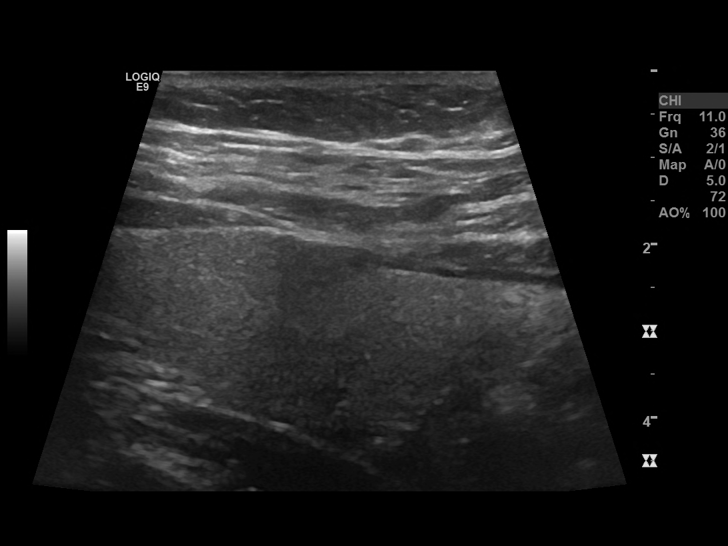
[im 15/44]
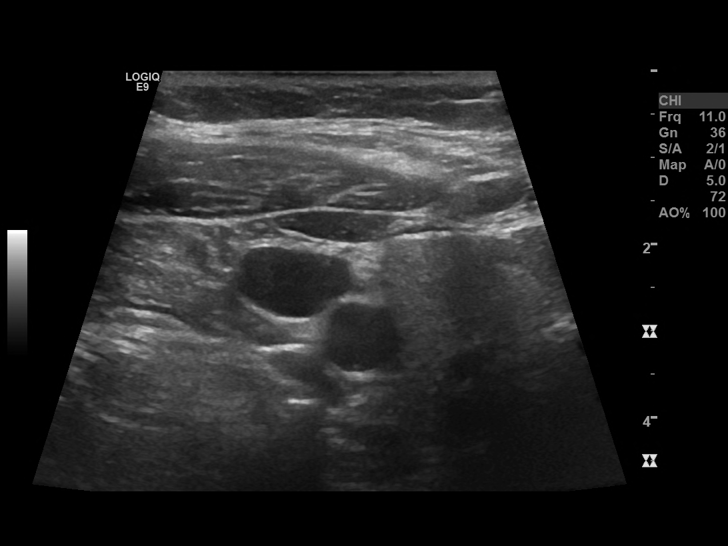
[im 17/44]
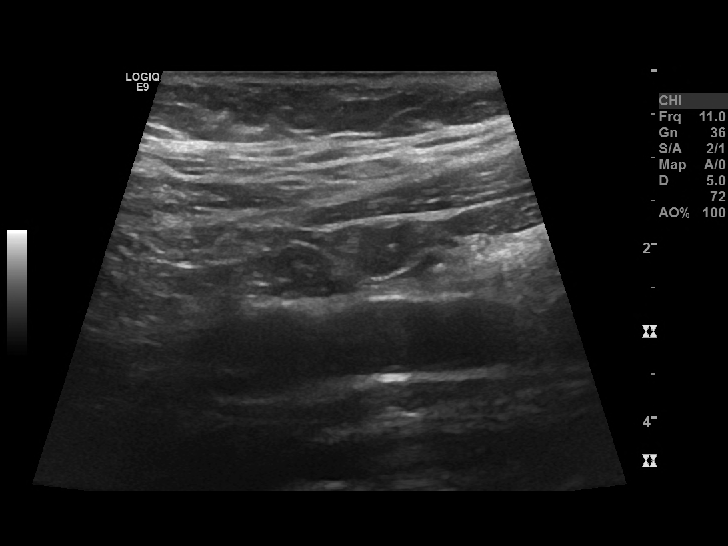
[im 20/44]
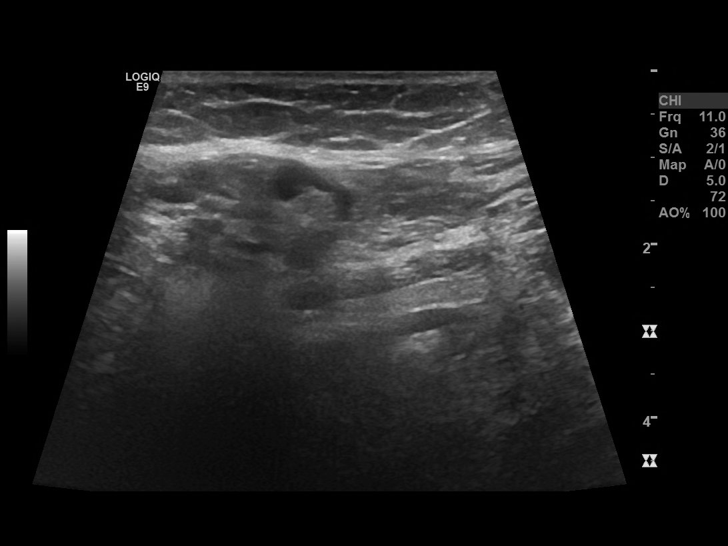
[im 24/44]
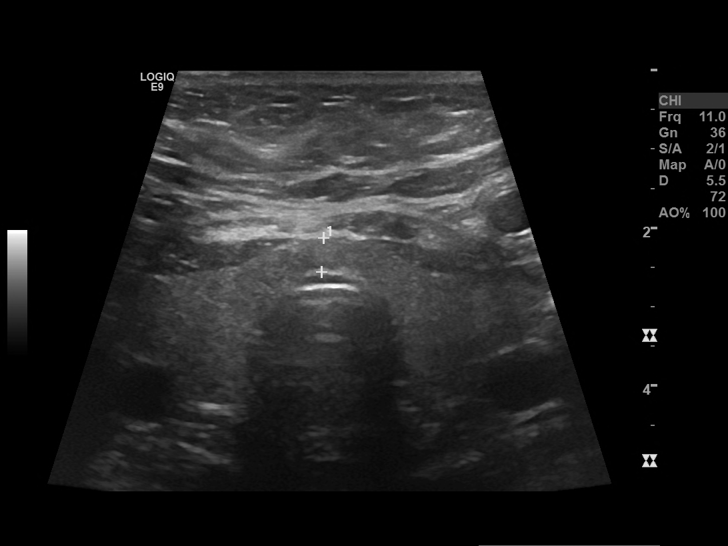
[im 27/44]
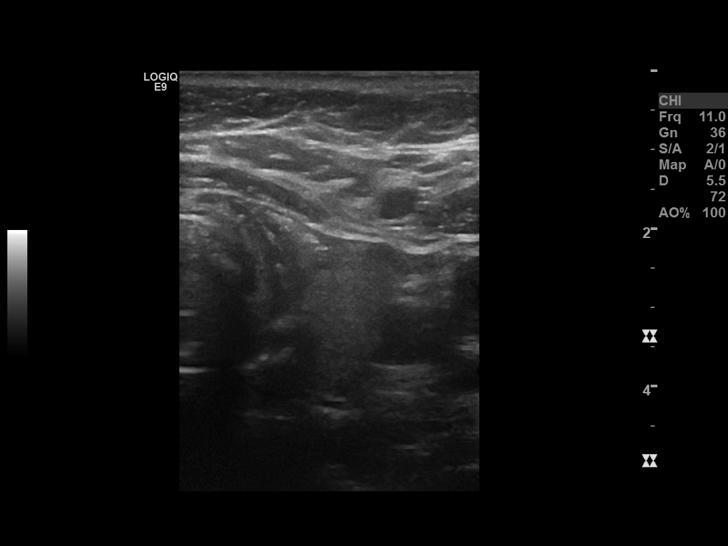
[im 29/44]
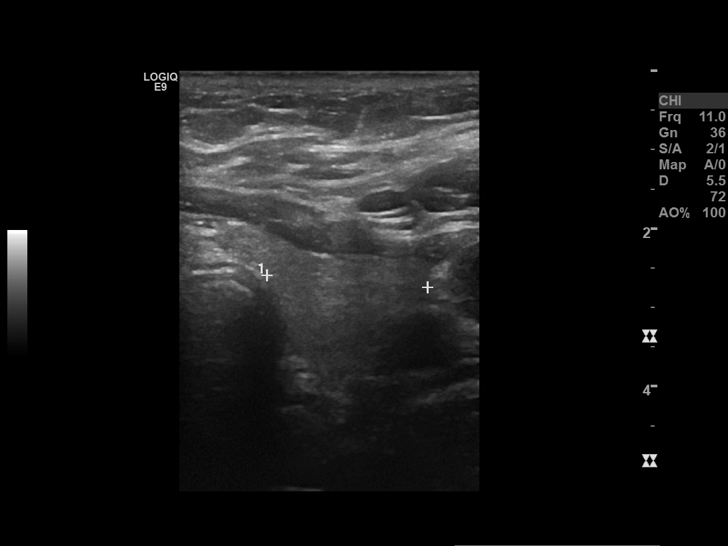
[im 33/44]
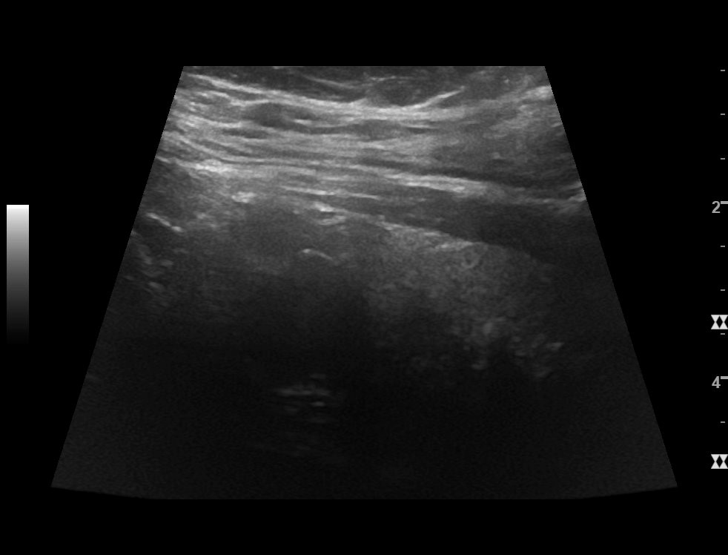
[im 36/44]
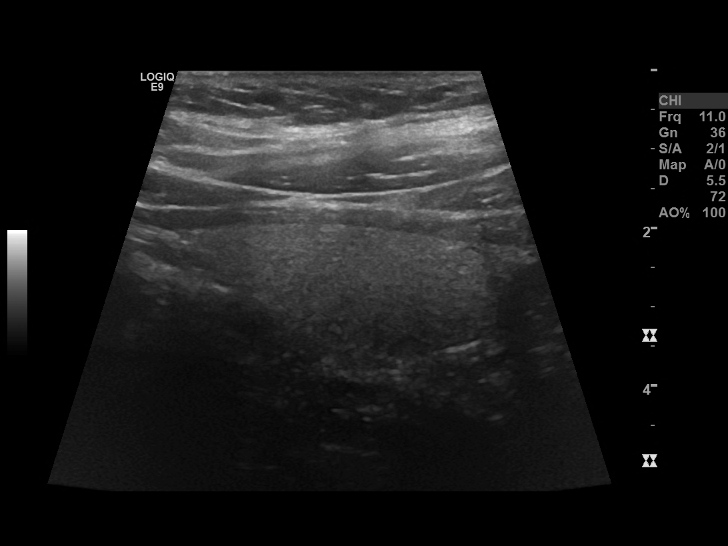
[im 40/44]
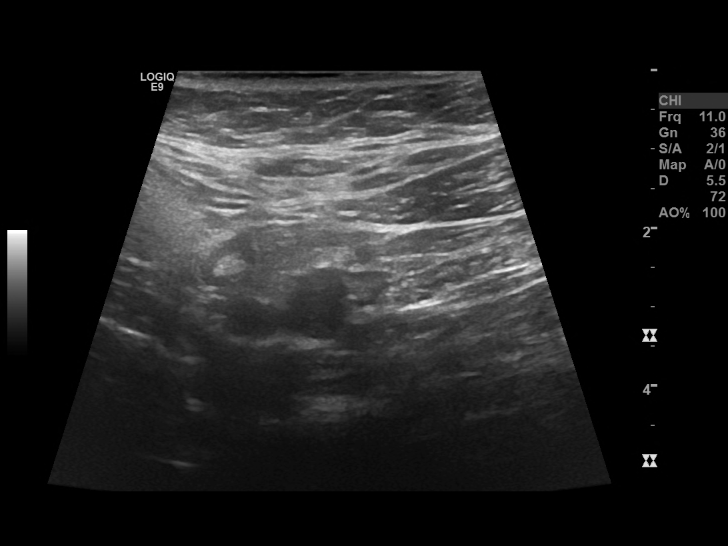
[im 44/44]
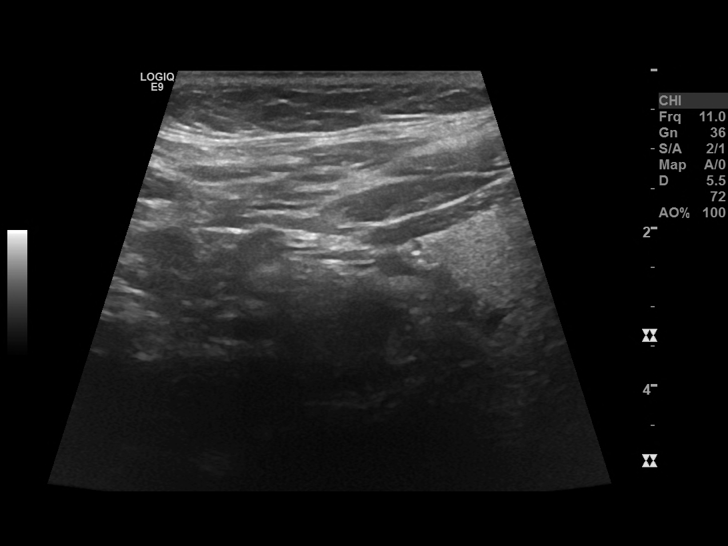

[14 of 25 positions shown; findings below may reference images not displayed]

Patient had CTA of chest 08/11/2017. Patient had PET/CT 10/10/2017 performed for a lung nodule.
FINDINGS: Thyroid size:

Right lobe: 58 x 19 x 20 mm.

Isthmus: 4 mm.

Left lobe: 40 x 17 x 20 mm

Thyroid nodules:

Right lobe nodules:

1. None.

Isthmus nodules:

1. None.

Left lobe nodules:

1. None.

Thyroid vascularity is not increased. Thyroid has homogeneous echotexture.

Ultrasound of neck shows a lymph node in the submandibular region on the right, level I, measuring 10 x 7 x 9 mm.
IMPRESSION: 1. Enlarged right lobe of thyroid.

2. No thyroid nodules.

3. Lymph node, right submandibular region.

## 2020-09-15 IMAGING — US US NON OB TRANSVAGINAL W LTD TA
1 series · 14 of 28 positions shown · non-contrast
Comparison: Pelvic ultrasound study 08/30/2014, CT abdomen and pelvis exam 08/18/2020.

HISTORY: 49 year-old female with pelvic pain.
TECHNIQUE: Transabdominal and transvaginal ultrasound examinations of pelvis were performed.

[Series 2: us non ob transvaginal w ltd ta · 14 of 35 slices shown]
[im 2/35]
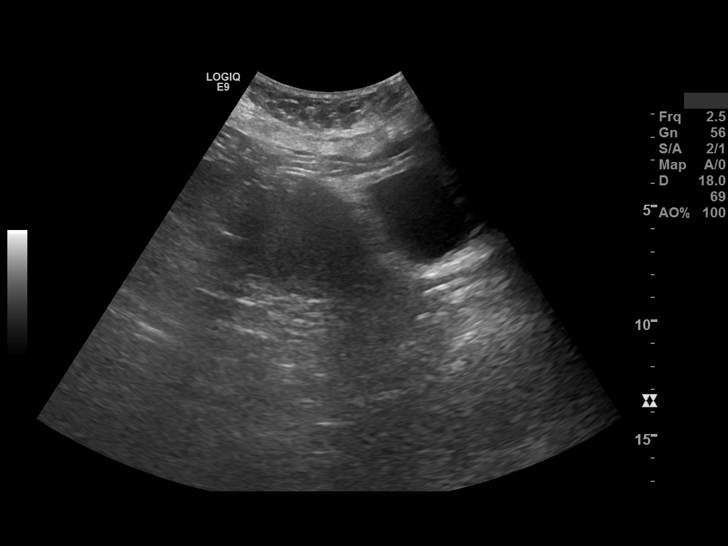
[im 4/35]
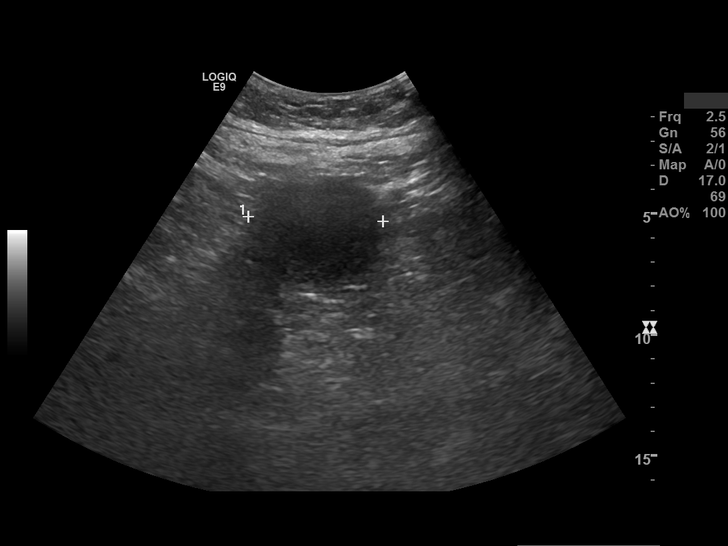
[im 7/35]
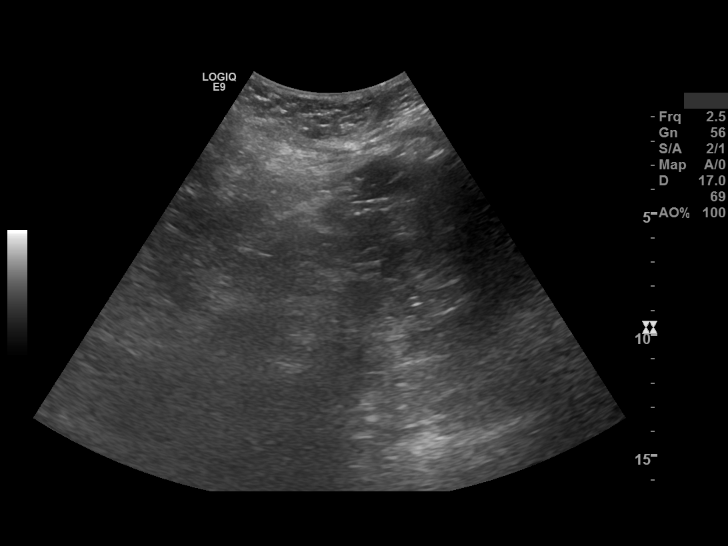
[im 9/35]
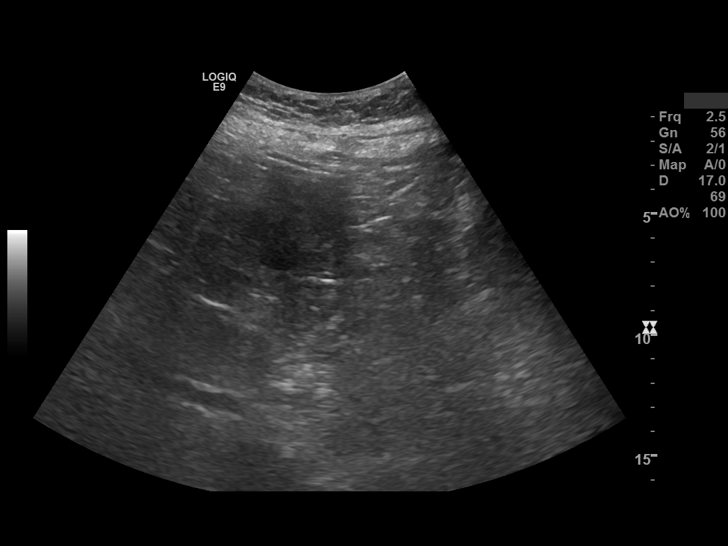
[im 12/35]
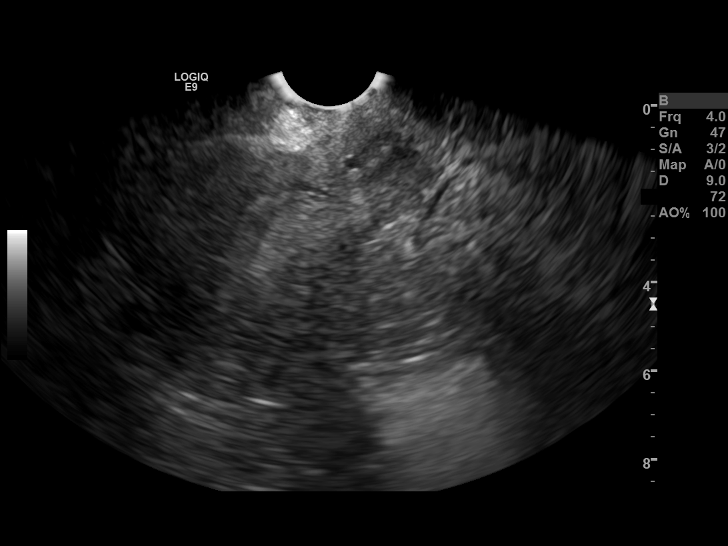
[im 14/35]
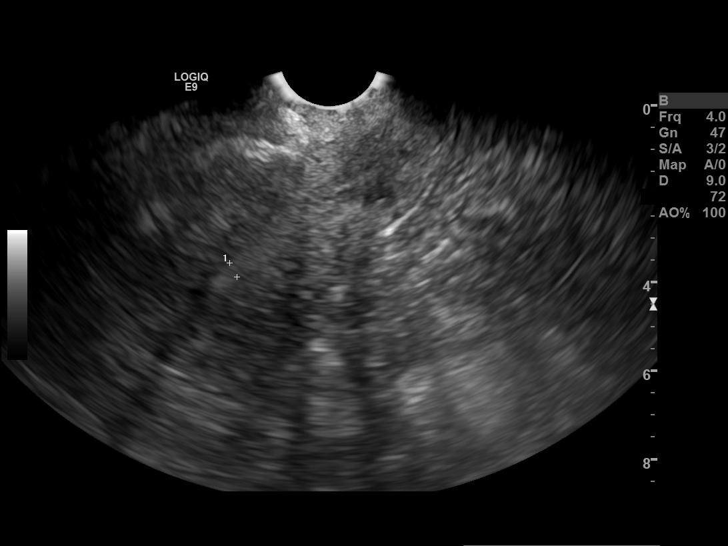
[im 17/35]
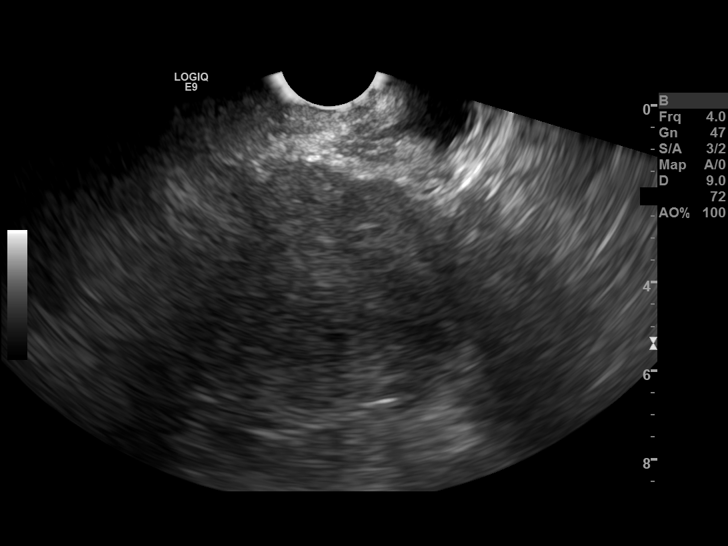
[im 19/35]
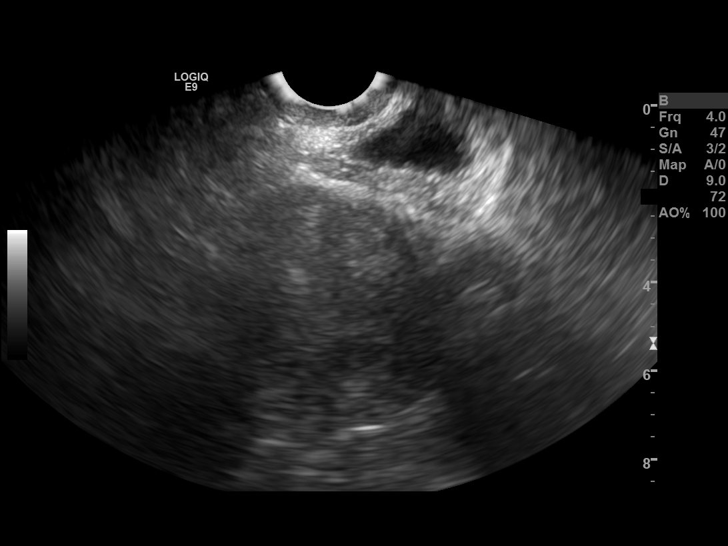
[im 22/35]
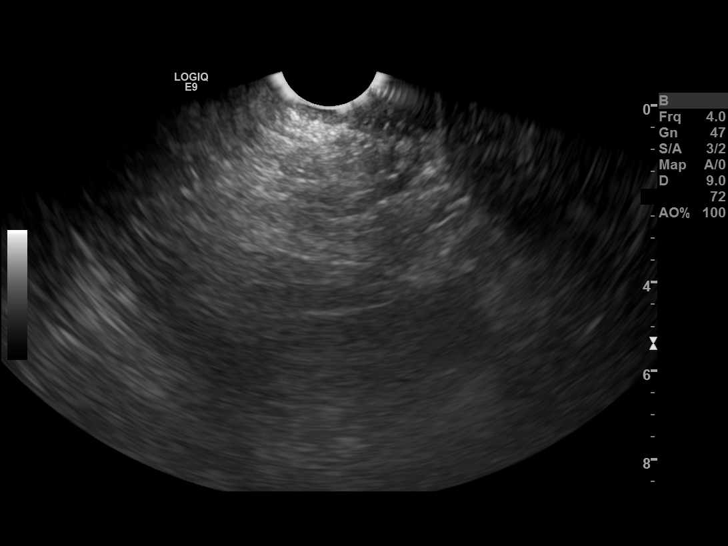
[im 24/35]
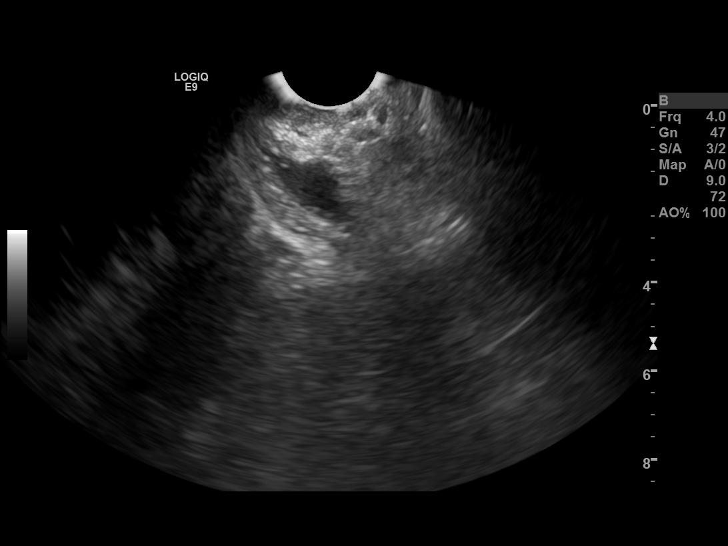
[im 27/35]
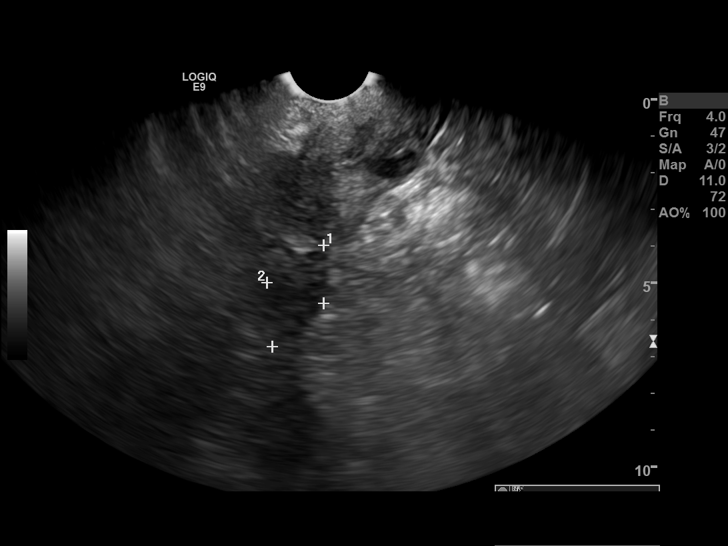
[im 29/35]
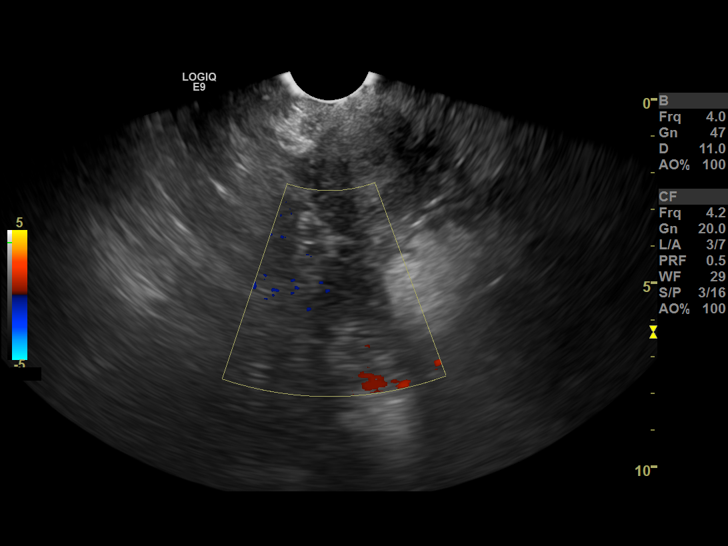
[im 32/35]
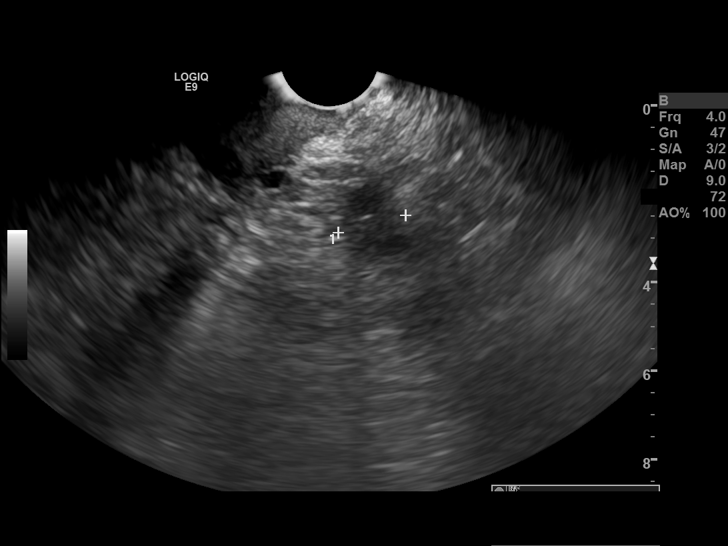
[im 35/35]
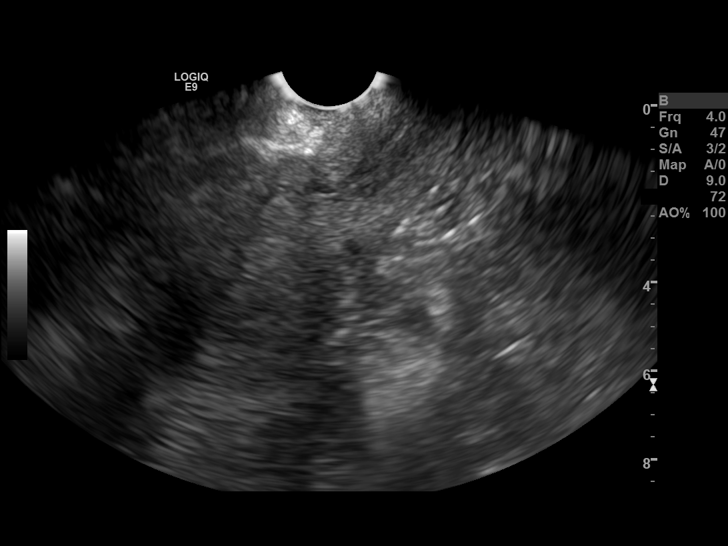

[14 of 28 positions shown; findings below may reference images not displayed]

FINDINGS: The uterus measures 95.82 x 50.8 x 55.60 mm. Heterogeneous echotexture of uterus seen. Nabothian cysts seen. Nabothian cyst with likely internal debris seen. No discrete uterine fibroid is seen.

The endometrium measures 3.6 mm. 

The right ovary measures 30.9 x 16.4  x 18.8 mm. Right ovary volume is 4.99 ml. 

The left ovary measures 24.5 x 12.8 x 15.7 mm.  Left ovary volume is 2.58 ml.

Doppler arterial and venous color flows are present in both ovaries.

Cul-de-sac: There is no significant fluid.

Others: No adnexal or ovarian mass or cyst is seen.
IMPRESSION: Heterogeneous echotexture of uterus seen. MRI pelvic study may exclude possibility of uterine adenomyomatosis.

Simple nabothian cysts and complex nabothian cyst with internal debris seen.

No other acute disease or change.

## 2021-06-09 ENCOUNTER — Emergency Department
Admission: EM | Admit: 2021-06-09 | Discharge: 2021-06-09 | Disposition: A | Discharge status: Home / Self Care | Payer: Self-pay | Attending: Emergency Medicine | Admitting: Emergency Medicine

## 2021-06-09 ENCOUNTER — Emergency Department: Payer: Self-pay

## 2021-06-09 DIAGNOSIS — I509 Heart failure, unspecified: Secondary | ICD-10-CM | POA: Insufficient documentation

## 2021-06-09 DIAGNOSIS — Z20822 Contact with and (suspected) exposure to covid-19: Secondary | ICD-10-CM | POA: Insufficient documentation

## 2021-06-09 DIAGNOSIS — R079 Chest pain, unspecified: Secondary | ICD-10-CM | POA: Insufficient documentation

## 2021-06-09 DIAGNOSIS — R9431 Abnormal electrocardiogram [ECG] [EKG]: Secondary | ICD-10-CM

## 2021-06-09 DIAGNOSIS — R202 Paresthesia of skin: Secondary | ICD-10-CM | POA: Insufficient documentation

## 2021-06-09 HISTORY — DX: Heart failure, unspecified: I50.9

## 2021-06-09 HISTORY — DX: Unspecified atrial fibrillation: I48.91

## 2021-06-09 LAB — BASIC METABOLIC PANEL
Anion Gap: 8 (ref 5.0–15.0)
BUN: 12 mg/dL (ref 7.0–19.0)
CO2: 21 mEq/L — ABNORMAL LOW (ref 22–29)
Calcium: 9.1 mg/dL (ref 8.5–10.5)
Chloride: 110 mEq/L (ref 100–111)
Creatinine: 0.5 mg/dL — ABNORMAL LOW (ref 0.6–1.0)
Glucose: 106 mg/dL — ABNORMAL HIGH (ref 70–100)
Potassium: 3.9 mEq/L (ref 3.5–5.1)
Sodium: 139 mEq/L (ref 136–145)

## 2021-06-09 LAB — CBC AND DIFFERENTIAL
Absolute NRBC: 0 10*3/uL (ref 0.00–0.00)
Basophils Absolute Automated: 0.05 10*3/uL (ref 0.00–0.08)
Basophils Automated: 0.6 %
Eosinophils Absolute Automated: 0.06 10*3/uL (ref 0.00–0.44)
Eosinophils Automated: 0.7 %
Hematocrit: 35 % (ref 34.7–43.7)
Hgb: 11.7 g/dL (ref 11.4–14.8)
Immature Granulocytes Absolute: 0.02 10*3/uL (ref 0.00–0.07)
Immature Granulocytes: 0.2 %
Lymphocytes Absolute Automated: 3.01 10*3/uL (ref 0.42–3.22)
Lymphocytes Automated: 33.9 %
MCH: 28.4 pg (ref 25.1–33.5)
MCHC: 33.4 g/dL (ref 31.5–35.8)
MCV: 85 fL (ref 78.0–96.0)
MPV: 9.1 fL (ref 8.9–12.5)
Monocytes Absolute Automated: 0.59 10*3/uL (ref 0.21–0.85)
Monocytes: 6.7 %
Neutrophils Absolute: 5.14 10*3/uL (ref 1.10–6.33)
Neutrophils: 57.9 %
Nucleated RBC: 0 /100 WBC (ref 0.0–0.0)
Platelets: 267 10*3/uL (ref 142–346)
RBC: 4.12 10*6/uL (ref 3.90–5.10)
RDW: 14 % (ref 11–15)
WBC: 8.87 10*3/uL (ref 3.10–9.50)

## 2021-06-09 LAB — URINALYSIS REFLEX TO MICROSCOPIC EXAM - REFLEX TO CULTURE
Bilirubin, UA: NEGATIVE
Blood, UA: NEGATIVE
Glucose, UA: NEGATIVE
Ketones UA: 5 — AB
Leukocyte Esterase, UA: NEGATIVE
Nitrite, UA: NEGATIVE
Protein, UR: NEGATIVE
Specific Gravity UA: 1.027 (ref 1.001–1.035)
Urine pH: 5 (ref 5.0–8.0)
Urobilinogen, UA: NORMAL mg/dL (ref 0.2–2.0)

## 2021-06-09 LAB — I-STAT WHOLE BLOOD B-HCG, TOTAL: i-stat Whole Blood B-HCG, Total: <5 IU/L

## 2021-06-09 LAB — LIPASE: Lipase: 13 U/L (ref 8–78)

## 2021-06-09 LAB — ECG 12-LEAD
Atrial Rate: 84 {beats}/min
IHS MUSE NARRATIVE AND IMPRESSION: NORMAL
P Axis: 40 degrees
P-R Interval: 174 ms
Q-T Interval: 376 ms
QRS Duration: 72 ms
QTC Calculation (Bezet): 444 ms
R Axis: 10 degrees
T Axis: 34 degrees
Ventricular Rate: 84 {beats}/min

## 2021-06-09 LAB — COVID-19 (SARS-COV-2): SARS CoV-2: NEGATIVE

## 2021-06-09 LAB — GFR: EGFR: >60

## 2021-06-09 LAB — HEPATIC FUNCTION PANEL
ALT: 13 U/L (ref 0–55)
AST (SGOT): 10 U/L (ref 5–34)
Albumin/Globulin Ratio: 1.2 (ref 0.9–2.2)
Albumin: 3.5 g/dL (ref 3.5–5.0)
Alkaline Phosphatase: 57 U/L (ref 37–117)
Bilirubin Direct: 0.2 mg/dL (ref 0.0–0.5)
Bilirubin Indirect: 0.2 mg/dL (ref 0.2–1.0)
Bilirubin, Total: 0.4 mg/dL (ref 0.2–1.2)
Globulin: 3 g/dL (ref 2.0–3.6)
Protein, Total: 6.5 g/dL (ref 6.0–8.3)

## 2021-06-09 LAB — LACTIC ACID, PLASMA: Lactic Acid: 0.6 mmol/L (ref 0.2–2.0)

## 2021-06-09 LAB — B-TYPE NATRIURETIC PEPTIDE: B-Natriuretic Peptide: 77 pg/mL (ref 0–100)

## 2021-06-09 LAB — TROPONIN I
Troponin I: <0.01 ng/mL (ref 0.00–0.05)
Troponin I: <0.01 ng/mL (ref 0.00–0.05)

## 2021-06-09 MED ORDER — IOHEXOL 350 MG/ML IV SOLN
100.0000 mL | Freq: Once | INTRAVENOUS | Status: AC | PRN
Start: 2021-06-09 — End: 2021-06-09
  Administered 2021-06-09: 100 mL via INTRAVENOUS

## 2021-06-09 MED ORDER — LOSARTAN POTASSIUM 25 MG PO TABS
50.0000 mg | ORAL_TABLET | Freq: Every day | ORAL | Status: DC
Start: 2021-06-09 — End: 2021-06-10
  Administered 2021-06-09: 50 mg via ORAL
  Filled 2021-06-09: qty 2

## 2021-06-09 MED ORDER — LOSARTAN POTASSIUM 25 MG PO TABS
50.0000 mg | ORAL_TABLET | Freq: Every day | ORAL | Status: DC
Start: 2021-06-10 — End: 2021-06-09

## 2021-06-09 MED ORDER — LOSARTAN POTASSIUM 25 MG PO TABS
25.0000 mg | ORAL_TABLET | Freq: Every day | ORAL | Status: DC
Start: 2021-06-10 — End: 2021-06-09

## 2021-06-09 MED ORDER — CARVEDILOL 6.25 MG PO TABS
3.1250 mg | ORAL_TABLET | Freq: Two times a day (BID) | ORAL | Status: DC
Start: 2021-06-09 — End: 2021-06-10
  Administered 2021-06-09: 3.125 mg via ORAL
  Filled 2021-06-09: qty 1

## 2021-06-09 NOTE — ED Provider Notes (Signed)
[facility] EMERGENCY DEPARTMENT H&P      Visit date: 06/09/2021      CLINICAL SUMMARY           Diagnosis:    .     Final diagnoses:   Chest pain, unspecified type         MDM Notes:      Based on the patient's history evaluation unclear as the etiology of her symptoms today.  Given her chest pain with arm paresthesia was concerned about a number different issues including dissection, ACS, PE, etc.  Multiple studies were obtained including CTA.  These were all negative.  Repeat troponin was also obtained, continues to trend negative.  Patient has a history of heart failure although sounds nonischemic based on her story.  On additional history patient has been having symptoms for approximately a week, just gotten worse today.  Patient's cardiologist is in [place], although she is staying up here for many more months.  Advised to follow-up with cardiology here.  Message sent through cardiac connect for more rapid follow-up.  Patient is being discharged stable condition.         Disposition:      Discharge         Discharge Prescriptions    None                     CLINICAL INFORMATION        HPI:      Chief Complaint: Chest Pain and Shortness of Breath  .    Zylee Marchiano is a 50 y.o. female w/ pmhx of a-fib and CHF who presents with sudden onset constant left sided 5/10 CP radiating towards the back a/w SOB, and dizziness since this morning and new onset left arm paraesthesia while in the ED. Pt reported being unable to take a deep breath yesterday and noted recording a systolic BP of 240 today. Pt denies any fever, cough, hx of cancer/blood clots, smoking, or drinking. Not on blood thinners. Not Covid vaccinated.        History obtained from: Patient          ROS:      Positive and negative ROS elements as per HPI.  All other systems reviewed and negative.      Physical Exam:      Pulse 81  BP (!) 190/99  Resp 18  SpO2 98 %  Temp 97.1 F (36.2 C)    Constitutional: Overweight  female, laying on a gurney, in no acute distress  Head: Normocephalic, atraumatic  Eyes: EOMI.  Sclera not pale, injected.  ENT: Mucous membranes moist.  Nose symmetric.  Neck: Normal range of motion. Non-tender.  Respiratory/Chest: Clear to auscultation. No respiratory distress.   Cardiovascular: RRR w/o murmur/gallop  Abdomen: Soft and non-tender. No guarding. No masses or hepatosplenomegaly.  Musculoskeletal: No edema. No cyanosis.  Neurological: CN II-XII intact grossly. No focal motor deficits by observation. Speech normal.  Skin: Warm and dry. No rash.  Psychiatric: Normal affect. Normal concentration.            PAST HISTORY        Primary Care Provider: Pcp, None, MD        PMH/PSH:    .     Past Medical History:   Diagnosis Date    Atrial fibrillation     CHF (congestive heart failure)        She has a past surgical history that includes Cesarean  section and Hernia repair.      Social/Family History:      She reports that she has never smoked. She has never used smokeless tobacco. She reports that she does not drink alcohol and does not use drugs.    History reviewed. No pertinent family history.      Listed Medications on Arrival:    .     Home Medications       Med List Status: In Progress Set By: Alveria Apley, RN at 06/09/2021  4:13 PM              carvedilol (COREG) 3.125 MG tablet     Take 3.125 mg by mouth     losartan (COZAAR) 100 MG tablet     Take 100 mg by mouth daily     spironolactone (ALDACTONE) 50 MG tablet     Take 50 mg by mouth daily           Allergies: She has No Known Allergies.            VISIT INFORMATION        Clinical Course in the ED:                 Medications Given in the ED:    .     ED Medication Orders (From admission, onward)      Start Ordered     Status Ordering Provider    06/10/21 0900 06/09/21 2110    Daily        Route: Oral  Ordered Dose: 25 mg     Discontinued Gaynor Ferreras M JR.    06/10/21 0900 06/09/21 2113    Daily        Route: Oral  Ordered Dose: 50 mg      Discontinued Hidaya Daniel M JR.    06/09/21 2118 06/09/21 2117  losartan (COZAAR) tablet 50 mg  Daily        Route: Oral  Ordered Dose: 50 mg     Last MAR action: Given Venissa Nappi M JR.    06/09/21 2111 06/09/21 2110  carvedilol (COREG) tablet 3.125 mg  Every 12 hours cardiac        Route: Oral  Ordered Dose: 3.125 mg     Last MAR action: Given Twinkle Sockwell M JR.    06/09/21 1848 06/09/21 1848  iohexol (OMNIPAQUE) 350 MG/ML injection 100 mL  IMG once as needed        Route: Intravenous  Ordered Dose: 100 mL     Last MAR action: Imaging Agent Given Ismael Karge, Louanne Belton.              Procedures:      Procedures      Interpretations:      O2 sat-           saturation: 98 %; Oxygen use: room air; Interpretation: Normal    EKG -             interpreted by me: normal sinus at 84.                RESULTS        Lab Results:      Results       Procedure Component Value Units Date/Time    Troponin I [[id number]] Collected: 06/09/21 2103    Specimen: Blood Updated: 06/09/21 2147     Troponin I <0.01 ng/mL     COVID-19 (SARS-COV-2) ([facility]) [[id number]]  Collected: 06/09/21 1636    Specimen: Nasopharyngeal Swab from Nasopharynx Updated: 06/09/21 1748     Purpose of COVID testing Screening     SARS-CoV-2 Specimen Source Nasopharyngeal     SARS CoV-2 Negative    Narrative:      o Collect and clearly label specimen type:  o Upper respiratory specimen: One Nasopharyngeal Dry Swab NO  Transport Media.  o Hand deliver to laboratory ASAP  Indication for testing->Extended care facility admission to  semi private room  Screening    i-stat Whole Blood B-HCG, Total [[id number]] Collected: 06/09/21 1704     Updated: 06/09/21 1718     i-stat Whole Blood B-HCG, Total <5.0 IU/L     Urinalysis Reflex to Microscopic Exam- Reflex to Culture [[id number]]  (Abnormal) Collected: 06/09/21 1622     Updated: 06/09/21 1657     Urine Type Urine, Clean Ca     Color, UA Amber     Clarity, UA Hazy     Specific Gravity UA 1.027     Urine pH 5.0      Leukocyte Esterase, UA Negative     Nitrite, UA Negative     Protein, UR Negative     Glucose, UA Negative     Ketones UA 5     Urobilinogen, UA Normal mg/dL      Bilirubin, UA Negative     Blood, UA Negative    Troponin I [[id number]] Collected: 06/09/21 1319    Specimen: Blood Updated: 06/09/21 1359     Troponin I <0.01 ng/mL     B-type Natriuretic Peptide [[id number]] Collected: 06/09/21 1319    Specimen: Blood Updated: 06/09/21 1359     B-Natriuretic Peptide 77 pg/mL     Basic Metabolic Panel [[id number]]  (Abnormal) Collected: 06/09/21 1319    Specimen: Blood Updated: 06/09/21 1357     Glucose 106 mg/dL      BUN 25.3 mg/dL      Creatinine 0.5 mg/dL      Calcium 9.1 mg/dL      Sodium 664 mEq/L      Potassium 3.9 mEq/L      Chloride 110 mEq/L      CO2 21 mEq/L      Anion Gap 8.0    Hepatic function panel (LFT) [[id number]] Collected: 06/09/21 1319    Specimen: Blood Updated: 06/09/21 1357     Bilirubin, Total 0.4 mg/dL      Bilirubin Direct 0.2 mg/dL      Bilirubin Indirect 0.2 mg/dL      AST (SGOT) 10 U/L      ALT 13 U/L      Alkaline Phosphatase 57 U/L      Protein, Total 6.5 g/dL      Albumin 3.5 g/dL      Globulin 3.0 g/dL      Albumin/Globulin Ratio 1.2    Lipase [[id number]] Collected: 06/09/21 1319    Specimen: Blood Updated: 06/09/21 1357     Lipase 13 U/L     GFR [[id number]] Collected: 06/09/21 1319     Updated: 06/09/21 1357     EGFR >60.0       CBC and differential [[id number]] Collected: 06/09/21 1319    Specimen: Blood Updated: 06/09/21 1339     WBC 8.87 x10 3/uL      Hgb 11.7 g/dL      Hematocrit 10.9 %      Platelets 267 x10 3/uL      RBC 4.12 x10 6/uL  MCV 85.0 fL      MCH 28.4 pg      MCHC 33.4 g/dL      RDW 14 %      MPV 9.1 fL      Neutrophils 57.9 %      Lymphocytes Automated 33.9 %      Monocytes 6.7 %      Eosinophils Automated 0.7 %      Basophils Automated 0.6 %      Immature Granulocytes 0.2 %      Nucleated RBC 0.0 /100 WBC      Neutrophils Absolute 5.14 x10 3/uL      Lymphocytes Absolute  Automated 3.01 x10 3/uL      Monocytes Absolute Automated 0.59 x10 3/uL      Eosinophils Absolute Automated 0.06 x10 3/uL      Basophils Absolute Automated 0.05 x10 3/uL      Immature Granulocytes Absolute 0.02 x10 3/uL      Absolute NRBC 0.00 x10 3/uL     Lactic Acid [[id number]] Collected: 06/09/21 1319    Specimen: Blood Updated: 06/09/21 1331     Lactic Acid 0.6 mmol/L                 Radiology Results:      CT Angio AAA Chest/ Abdomen   Final Result         No evidence of aortic dissection.      Georgiana Spinner, MD    06/09/2021 7:10 PM                  Scribe Attestation:      I was acting as a Neurosurgeon for Rolen Conger, Louanne Belton., MD on [facility] [place]  Treatment Team: Scribe: Christen Bame    I am the first provider for this patient and I personally performed the services documented. Treatment Team: Scribe: Christen Bame is scribing for me on [facility] [place]. This note accurately reflects work and decisions made by me.  Encarnacion Bole, Louanne Belton., MD

## 2021-06-09 NOTE — ED Notes (Signed)
Bed: S B2  Expected date: 06/09/21  Expected time:   Means of arrival:   Comments:  Z1 moving here

## 2021-06-09 NOTE — ED Triage Notes (Signed)
Pt to ED c/o CP, SOB, dizziness x3 hours.  Pt pmhx afib, chf. EKG and labs drawn in triage. Pt speaking in complete sentences, denies nausea, vomiting, BLE edema, does not appear diaphoretic at this time. Pt endorses CP that starts on her left side and radiates to the left side of her back, rates pain 5/10 at this time, 7/10 when at its worst; pt verbalizes SOB exacerbated on exercise.

## 2021-06-09 NOTE — Discharge Instructions (Signed)
Dear Rebecca Curry:    Thank you for choosing the [facility] Emergency Department, the premier emergency department in the [place] area.  I hope your visit today was EXCELLENT. You will receive a survey via text message that will give you the opportunity to provide feedback to your team about your visit. Please do not hesitate to reach out with any questions!    Specific instructions for your visit today:        IF YOU DO NOT CONTINUE TO IMPROVE OR YOUR CONDITION WORSENS, PLEASE CONTACT YOUR DOCTOR OR RETURN IMMEDIATELY TO THE EMERGENCY DEPARTMENT.    Sincerely,  Carliss Porcaro, Louanne Belton., MD  Attending Emergency Physician  [facility] Emergency Department      OBTAINING A PRIMARY CARE APPOINTMENT    Primary care physicians (PCPs, also known as primary care doctors) are either internists or family medicine doctors. Both types of PCPs focus on health promotion, disease prevention, patient education and counseling, and treatment of acute and chronic medical conditions.    If you need a primary care doctor, please call the below number and ask who is receiving new patients.     [facility]  Telephone:  [phone]  [email or web address]    DOCTOR REFERRALS  Call [phone] (available 24 hours a day, 7 days a week) if you need any further referrals and we can help you find a primary care doctor or specialist.  Also, available online at:  [email or web address]    YOUR CONTACT INFORMATION  Before leaving please check with registration to make sure we have an up-to-date contact number.  You can call registration at [phone] to update your information.  For questions about your hospital bill, please call [phone].  For questions about your Emergency Dept Physician bill please call [phone].      FREE HEALTH SERVICES  If you need help with health or social services, please call 2-1-1 for a free referral to resources in your area.  2-1-1 is a free service connecting people  with information on health insurance, free clinics, pregnancy, mental health, dental care, food assistance, housing, and substance abuse counseling.  Also, available online at:  http://www.[place].org    ORTHOPEDIC INJURY   Please know that significant injuries can exist even when an initial x-ray is read as normal or negative.  This can occur because some fractures (broken bones) are not initially visible on x-rays.  For this reason, close outpatient follow-up with your primary care doctor or bone specialist (orthopedist) is required.    MEDICATIONS AND FOLLOWUP  Please be aware that some prescription medications can cause drowsiness.  Use caution when driving or operating machinery.    The examination and treatment you have received in our Emergency Department is provided on an emergency basis, and is not intended to be a substitute for your primary care physician.  It is important that your doctor checks you again and that you report any new or remaining problems at that time.      ASSISTANCE WITH INSURANCE    Affordable Care Act  [facility])  Call to start or finish an application, compare plans, enroll or ask a question.  [phone]  TTY: [phone]  Web:  Healthcare.gov    Help Enrolling in [facility]  [employer] [place]  289-224-4750 (TOLL-FREE)  812-877-3411 (TTY)  Web:  Http://www.coverva.org    Local Help Enrolling in the [facility]  [place] [facility]  (405)[phone] (MAIN)  Email:  health-help@nvfs .org  Web:  BlackjackMyths.is  Address:  [address] [place], [place] [address]    SEDATING MEDICATIONS  Sedating medications include strong pain medications (e.g. narcotics), muscle relaxers, benzodiazepines (used for anxiety and as muscle relaxers), Benadryl/diphenhydramine and other antihistamines for allergic reactions/itching, and other medications.  If you are unsure if you have received a sedating medication, please ask your physician or nurse.  If you received a sedating  medication: DO NOT drive a car. DO NOT operate machinery. DO NOT perform jobs where you need to be alert.  DO NOT drink alcoholic beverages while taking this medicine.     If you get dizzy, sit or lie down at the first signs. Be careful going up and down stairs.  Be extra careful to prevent falls.     Never give this medicine to others.     Keep this medicine out of reach of children.     Do not take or save old medicines. Throw them away when outdated.     Keep all medicines in a cool, dry place. DO NOT keep them in your bathroom medicine cabinet or in a cabinet above the stove.    MEDICATION REFILLS  Please be aware that we cannot refill any prescriptions through the ER. If you need further treatment from what is provided at your ER visit, please follow up with your primary care doctor or your pain management specialist.    [place]  Did you know [facility] has two freestanding ERs located just a few miles away?  [facility] [place] have short wait times, easy free parking directly in front of the building and top patient satisfaction scores - and the same Board Certified Emergency Medicine doctors as [facility].

## 2021-06-09 NOTE — ED Notes (Addendum)
Pt ready for CT Angio. Pt resting on hall z-spot stretcher in seated position. Warm blanket provided, emesis bag provided for new onset nausea

## 2021-06-09 NOTE — ED Notes (Signed)
MD at bedside. Pt reporting new onset numbness in L arm, reporting nausea.  RN called TTL and charge RN for new bed assignment with cardiac monitoring capability.  No bed available at this time. RN transport cardiac monitor at this time. New order for CTAngio

## 2021-06-09 NOTE — ED Notes (Signed)
POC Urine negative for pregnancy

## 2021-07-25 ENCOUNTER — Emergency Department: Payer: Self-pay

## 2021-07-25 ENCOUNTER — Observation Stay
Admission: EM | Admit: 2021-07-25 | Discharge: 2021-07-26 | Disposition: A | Discharge status: Home / Self Care | Payer: Self-pay | Attending: Internal Medicine | Admitting: Internal Medicine

## 2021-07-25 ENCOUNTER — Observation Stay: Payer: Self-pay

## 2021-07-25 DIAGNOSIS — R0602 Shortness of breath: Secondary | ICD-10-CM | POA: Insufficient documentation

## 2021-07-25 DIAGNOSIS — Z7901 Long term (current) use of anticoagulants: Secondary | ICD-10-CM | POA: Insufficient documentation

## 2021-07-25 DIAGNOSIS — R079 Chest pain, unspecified: Secondary | ICD-10-CM | POA: Diagnosis present

## 2021-07-25 DIAGNOSIS — E1165 Type 2 diabetes mellitus with hyperglycemia: Secondary | ICD-10-CM | POA: Insufficient documentation

## 2021-07-25 DIAGNOSIS — Z79899 Other long term (current) drug therapy: Secondary | ICD-10-CM | POA: Insufficient documentation

## 2021-07-25 DIAGNOSIS — M79602 Pain in left arm: Secondary | ICD-10-CM | POA: Insufficient documentation

## 2021-07-25 DIAGNOSIS — Z87891 Personal history of nicotine dependence: Secondary | ICD-10-CM | POA: Insufficient documentation

## 2021-07-25 DIAGNOSIS — Z8249 Family history of ischemic heart disease and other diseases of the circulatory system: Secondary | ICD-10-CM | POA: Insufficient documentation

## 2021-07-25 DIAGNOSIS — Z6834 Body mass index (BMI) 34.0-34.9, adult: Secondary | ICD-10-CM | POA: Insufficient documentation

## 2021-07-25 DIAGNOSIS — I509 Heart failure, unspecified: Secondary | ICD-10-CM | POA: Insufficient documentation

## 2021-07-25 DIAGNOSIS — R202 Paresthesia of skin: Secondary | ICD-10-CM | POA: Insufficient documentation

## 2021-07-25 DIAGNOSIS — Z8679 Personal history of other diseases of the circulatory system: Secondary | ICD-10-CM

## 2021-07-25 DIAGNOSIS — R0789 Other chest pain: Principal | ICD-10-CM | POA: Insufficient documentation

## 2021-07-25 DIAGNOSIS — I110 Hypertensive heart disease with heart failure: Secondary | ICD-10-CM | POA: Insufficient documentation

## 2021-07-25 DIAGNOSIS — I4891 Unspecified atrial fibrillation: Secondary | ICD-10-CM | POA: Insufficient documentation

## 2021-07-25 DIAGNOSIS — E669 Obesity, unspecified: Secondary | ICD-10-CM | POA: Insufficient documentation

## 2021-07-25 DIAGNOSIS — I10 Essential (primary) hypertension: Secondary | ICD-10-CM

## 2021-07-25 HISTORY — PX: US VENOUS ULTRASOUND LE/UE: IMG10179

## 2021-07-25 HISTORY — DX: Type 2 diabetes mellitus without complications: E11.9

## 2021-07-25 HISTORY — PX: OTHER SURGICAL HISTORY: SHX169

## 2021-07-25 LAB — COMPREHENSIVE METABOLIC PANEL
ALT: 17 U/L (ref 0–55)
AST (SGOT): 15 U/L (ref 5–34)
Albumin/Globulin Ratio: 1.2 (ref 0.9–2.2)
Albumin: 3.9 g/dL (ref 3.5–5.0)
Alkaline Phosphatase: 74 U/L (ref 37–117)
Anion Gap: 5 (ref 5.0–15.0)
BUN: 10 mg/dL (ref 7.0–19.0)
Bilirubin, Total: 0.4 mg/dL (ref 0.2–1.2)
CO2: 26 mEq/L (ref 22–29)
Calcium: 9.4 mg/dL (ref 8.5–10.5)
Chloride: 106 mEq/L (ref 100–111)
Creatinine: 0.6 mg/dL (ref 0.6–1.0)
Globulin: 3.2 g/dL (ref 2.0–3.6)
Glucose: 105 mg/dL — ABNORMAL HIGH (ref 70–100)
Potassium: 4.4 mEq/L (ref 3.5–5.1)
Protein, Total: 7.1 g/dL (ref 6.0–8.3)
Sodium: 137 mEq/L (ref 136–145)

## 2021-07-25 LAB — CBC AND DIFFERENTIAL
Absolute NRBC: 0 10*3/uL (ref 0.00–0.00)
Basophils Absolute Automated: 0.04 10*3/uL (ref 0.00–0.08)
Basophils Automated: 0.6 %
Eosinophils Absolute Automated: 0.07 10*3/uL (ref 0.00–0.44)
Eosinophils Automated: 1 %
Hematocrit: 39.6 % (ref 34.7–43.7)
Hgb: 13.1 g/dL (ref 11.4–14.8)
Immature Granulocytes Absolute: 0.02 10*3/uL (ref 0.00–0.07)
Immature Granulocytes: 0.3 %
Lymphocytes Absolute Automated: 2.02 10*3/uL (ref 0.42–3.22)
Lymphocytes Automated: 30.2 %
MCH: 28.7 pg (ref 25.1–33.5)
MCHC: 33.1 g/dL (ref 31.5–35.8)
MCV: 86.7 fL (ref 78.0–96.0)
MPV: 9.2 fL (ref 8.9–12.5)
Monocytes Absolute Automated: 0.4 10*3/uL (ref 0.21–0.85)
Monocytes: 6 %
Neutrophils Absolute: 4.14 10*3/uL (ref 1.10–6.33)
Neutrophils: 61.9 %
Nucleated RBC: 0 /100 WBC (ref 0.0–0.0)
Platelets: 281 10*3/uL (ref 142–346)
RBC: 4.57 10*6/uL (ref 3.90–5.10)
RDW: 14 % (ref 11–15)
WBC: 6.69 10*3/uL (ref 3.10–9.50)

## 2021-07-25 LAB — LIPID PANEL
Cholesterol / HDL Ratio: 3.8 Index
Cholesterol: 157 mg/dL (ref 0–199)
HDL: 41 mg/dL (ref 40–9999)
LDL Calculated: 96 mg/dL (ref 0–99)
Triglycerides: 101 mg/dL (ref 34–149)
VLDL Calculated: 20 mg/dL (ref 10–40)

## 2021-07-25 LAB — B-TYPE NATRIURETIC PEPTIDE: B-Natriuretic Peptide: 22 pg/mL (ref 0–100)

## 2021-07-25 LAB — GFR: EGFR: >60

## 2021-07-25 LAB — I-STAT WHOLE BLOOD B-HCG, TOTAL: i-stat Whole Blood B-HCG, Total: <5 IU/L

## 2021-07-25 LAB — HEMOGLOBIN A1C
Average Estimated Glucose: 114 mg/dL
Hemoglobin A1C: 5.6 % (ref 4.6–5.9)

## 2021-07-25 LAB — TROPONIN I
Troponin I: <0.01 ng/mL (ref 0.00–0.05)
Troponin I: <0.01 ng/mL (ref 0.00–0.05)
Troponin I: <0.01 ng/mL (ref 0.00–0.05)

## 2021-07-25 LAB — TSH: TSH: 1.19 u[IU]/mL (ref 0.35–4.94)

## 2021-07-25 LAB — LIPASE: Lipase: 19 U/L (ref 8–78)

## 2021-07-25 MED ORDER — KETOROLAC TROMETHAMINE 30 MG/ML IJ SOLN
INTRAMUSCULAR | Status: AC
Start: 2021-07-25 — End: 2021-07-26
  Filled 2021-07-25: qty 1

## 2021-07-25 MED ORDER — IOHEXOL 350 MG/ML IV SOLN
100.0000 mL | Freq: Once | INTRAVENOUS | Status: AC | PRN
Start: 2021-07-25 — End: 2021-07-25
  Administered 2021-07-25: 13:00:00 70 mL via INTRAVENOUS

## 2021-07-25 MED ORDER — SPIRONOLACTONE 50 MG PO TABS
50.0000 mg | ORAL_TABLET | Freq: Every day | ORAL | Status: DC
Start: 2021-07-25 — End: 2021-07-25

## 2021-07-25 MED ORDER — LOSARTAN POTASSIUM 25 MG PO TABS
100.0000 mg | ORAL_TABLET | Freq: Every day | ORAL | Status: DC
Start: 2021-07-25 — End: 2021-07-25

## 2021-07-25 MED ORDER — LIDOCAINE 5 % EX PTCH
1.0000 | MEDICATED_PATCH | CUTANEOUS | Status: DC
Start: 2021-07-25 — End: 2021-07-26
  Administered 2021-07-25: 19:00:00 1 via TRANSDERMAL
  Filled 2021-07-25: qty 1

## 2021-07-25 MED ORDER — LOSARTAN POTASSIUM 25 MG PO TABS
50.0000 mg | ORAL_TABLET | Freq: Two times a day (BID) | ORAL | Status: DC
Start: 2021-07-25 — End: 2021-07-26
  Administered 2021-07-25 – 2021-07-26 (×2): 50 mg via ORAL
  Filled 2021-07-25 (×2): qty 2

## 2021-07-25 MED ORDER — CARVEDILOL 6.25 MG PO TABS
6.2500 mg | ORAL_TABLET | Freq: Two times a day (BID) | ORAL | Status: DC
Start: 2021-07-25 — End: 2021-07-26
  Administered 2021-07-25 – 2021-07-26 (×2): 6.25 mg via ORAL
  Filled 2021-07-25 (×2): qty 1

## 2021-07-25 MED ORDER — KETOROLAC TROMETHAMINE 30 MG/ML IJ SOLN
30.0000 mg | Freq: Once | INTRAMUSCULAR | Status: AC
Start: 2021-07-25 — End: 2021-07-25
  Administered 2021-07-25: 17:00:00 30 mg via INTRAVENOUS

## 2021-07-25 MED ORDER — ENOXAPARIN SODIUM 40 MG/0.4ML IJ SOSY
40.0000 mg | PREFILLED_SYRINGE | Freq: Every day | INTRAMUSCULAR | Status: DC
Start: 2021-07-25 — End: 2021-07-26
  Administered 2021-07-26: 09:00:00 40 mg via SUBCUTANEOUS
  Filled 2021-07-25: qty 0.4

## 2021-07-25 NOTE — ED Notes (Signed)
[facility] EMERGENCY DEPT  ED NURSING NOTE FOR THE RECEIVING INPATIENT NURSE   ED NURSE Laiken Nohr/Ji   [place] [address]   ED CHARGE RN [phone]   ADMISSION INFORMATION   Rebecca Curry is a 50 y.o. female admitted with an ED diagnosis of:    1. Chest pain         Isolation: None   Allergies: Latex, Penicillins, and Zofran [ondansetron]   Holding Orders confirmed? No   Belongings Documented? No   Home medications sent to pharmacy confirmed? N/A   NURSING CARE   Patient Comes From:   Mental Status: Home Independent  alert and oriented   ADL: Independent with all ADLs   Ambulation: mild difficulty   Pertinent Information  and Safety Concerns: Patient complains of chest pressure for the past couple days and intermittent chest pain, left arm pain, and upper back pain. HX of afib and CHF.  Trop neg and NSR on the monitor.       CT / NIH   CT Head ordered on this patient?  No   NIH/Dysphagia assessment done prior to admission? No   VITAL SIGNS (at the time of this note)      Vitals:    07/25/21 1554   BP: 150/90   Pulse: 70   Resp: 16   Temp: 97.6 F (36.4 C)   SpO2: 98%

## 2021-07-25 NOTE — Consults (Signed)
[place] HEART CARDIOLOGY CONSULTATION REPORT  [facility]    Date Time: 07/25/21 3:39 PM  Patient Name: Rebecca Curry  Requesting Physician: Monica Becton, MD       Reason for Consultation:   Chest pain    History:   Rebecca Curry is a 50 y.o. female admitted on 07/25/2021.  We have been asked by Monica Becton, MD,  to provide cardiac consultation, regarding chest pain.  She has a medical history of CHF unknown EF, diabetes mellitus, obesity with 200 pound weight loss through dietary adjustments, 60-pack-year smoking history now abstinent.  Collateral record indicates history of AF but she says this has not actually ever been formally diagnosed although she does on occasion have brief palpitations.  She underwent a left heart catheterization at [facility] in [place] in February 2021 and by her report there was no significant CAD and she did not undergo PCI or CABG.  She denies a personal history of CVA.  Over the last 5 days or so has had unrelenting left-sided chest discomfort radiating up to the left side of her jaw and neck and left shoulder area - waxes and wanes in severity but no true relief in that period of time.  She is not able to identify clear eliciting or relieving factors and specifically denies an exertional component and it does not seem to be worse with arm positional movements either.  She denies a known history of any cervical spine issues and says she did not fall or injure that side although she does predominantly sleep on her left side.  She has felt a bit nauseous lately but denies vomiting or fever or chills or syncope or diarrhea.    Occasional orthostatic dizziness especially bad several days ago although no loss of consciousness.  Currently during this evaluation still has ongoing pressure "claw" sensation in the left side of her chest up towards her left shoulder.  She is here visiting family and currently resides in [place].  She had a prior ED encounter here  06/09/2021 with chest discomfort and she reported having systolic blood pressures of 240 at home at that time.  Over the last several weeks at home has had uncontrolled blood pressures up to 170s systolic with diastolics sometimes up to 120s.  Relays a history of intolerance to several antihypertensives but no true allergy to any medicine that she can recall.  She specifically denies any angioedema type symptoms with trauma or lip swelling or difficulty breathing or hospitalization as a result of any medicine.  For many months has taken losartan 100 mg daily but only takes spironolactone 50 mg as needed.  Apparently was on a much higher dose of carvedilol 12.5 mg twice daily last year but her PCP down titrated this to 3.125 mg after she had a hypotensive spell with systolic 88.  She denies any bradycardia on higher dose Coreg.    Half brother (same father) had an MI in his 75s.  Father has hypertension and diabetes mellitus.  Paternal grandmother had her first MI in her 59s and died at age 52.  Denies illegal drugs, ritalin, amphetamines, excessive caffeine.    Past Medical History:     CHF  DM    Past Surgical History:     Past Surgical History:   Procedure Laterality Date    CESAREAN SECTION      x2    HERNIA REPAIR         Family History:  History reviewed. No pertinent family history.    Social History:     Social History     Socioeconomic History    Marital status: Single     Spouse name: Not on file    Number of children: Not on file    Years of education: Not on file    Highest education level: Not on file   Occupational History    Not on file   Tobacco Use    Smoking status: Never    Smokeless tobacco: Never   Vaping Use    Vaping Use: Never used   Substance and Sexual Activity    Alcohol use: Never    Drug use: Never    Sexual activity: Not on file   Other Topics Concern    Not on file   Social History Narrative    Not on file     Social Determinants of Health     Financial Resource Strain: Not on file    Food Insecurity: Not on file   Transportation Needs: Not on file   Physical Activity: Not on file   Stress: Not on file   Social Connections: Not on file   Intimate Partner Violence: Not on file   Housing Stability: Not on file       Allergies:     Allergies   Allergen Reactions    Latex     Penicillins     Zofran [Ondansetron] Headaches       Medications:   (Not in a hospital admission)           Review of Systems:    Comprehensive review of systems including constitutional, eyes, ears, nose, mouth, throat, cardiovascular, GI, GU, musculoskeletal, integumentary, respiratory, neurologic, psychiatric, and endocrine is negative other than what is mentioned already in the history of present illness    Physical Exam:     VITAL SIGNS PHYSICAL EXAM   Vitals:    07/25/21 1230   BP: 132/78   Pulse: 61   Resp: 16   Temp:    SpO2: 97%     Temp (24hrs), Avg:98 F (36.7 C), Min:98 F (36.7 C), Max:98 F (36.7 C)      Intake and Output Summary (Last 24 hours) at Date Time  No intake or output data in the 24 hours ending 07/25/21 1539    Telemetry: no changes Physical Exam  General: awake, alert, breathing comfortably, no acute distress  Head: normocephalic  Eyes: EOM's intact  Cardiovascular: regular rate and rhythm, normal S1, S2, no S3, no S4, no murmurs, rubs or gallops  Neck: no carotid bruits or JVD  Lungs: clear to auscultation bilaterally  Abdomen: non-guarding  Extremities: no pitting edema  Pulse: radial palpable  Neurological: Alert and oriented X3, mood and affect normal  Musculoskeletal: moves all extremities. Normal tone     Labs Reviewed:     Recent Labs   Lab 07/25/21  1041   Troponin I <0.01             Recent Labs   Lab 07/25/21  1041   Bilirubin, Total 0.4   Protein, Total 7.1   Albumin 3.9   ALT 17   AST (SGOT) 15             Recent Labs   Lab 07/25/21  1041   WBC 6.69   Hgb 13.1   Hematocrit 39.6   Platelets 281     Recent Labs   Lab 07/25/21  1041  Sodium 137   Potassium 4.4   Chloride 106   CO2 26    BUN 10.0   Creatinine 0.6   EGFR >60.0   Glucose 105*   Calcium 9.4       DIAGNOSTIC    EKG: Sinus, nonspecific twi, overall largely unchanged compared to 06/09/21 ECG    chest X-ray no acute cardiopulmonary process  CTA no PE    Assessment:   Chest pain, atypical and typical features, negative troponin in the face of unrelenting pain for days  CTA chest no PE  CHF unclear EF, underwent cardiac workup in [place] 2021 with LHC that patient reports showed no significant CAD, appears largely euvolemic here and BNP normal  DM, uncontrolled for many years, now diet controlled reportedly  HTN, uncontrolled at home, here largely acceptable in 120s-150s  Obesity BMI 34.9 with significant wt loss through diet alone 200 lb in last several years  60 pack year smoking history (2ppd), now abstinent  Family hx premature CAD half-brother MI in 53s, paternal grandmother first MI in 30s    Recommendations:   Trend another troponin  Likely not ACS given reportedly reassuring LHC last year, and negative initial troponin despite days of unrelenting pain. Possibly MSK vs other  Will call her prior practice in [place] to get cath records  Continue home antihypertensive regimen and monitor BP. Can adjust regimen as needed based on trends    Signed by: Lewanda Rife, PA    Attestation:  The patient has been seen and examined. VS reviewed. My Exam as below:    Physical Exam  Constitutional: Cooperative, alert, well-developed, well-nourished, in no acute distress  Cardiovascular: regular rate and rhythm, normal S1, S2, no S3, no S4, no murmurs, rubs or gallops  Neck: no carotid bruits, JVP normal  Lungs: clear to auscultation bilaterally, without wheezing, rhonchi, or rales  Abdomen: soft, non-tender  Extremities: no edema  Pulses: +2 and equal pulses radial  Musculoskeletal: normal strength and tone  Neurological: Appropriate affect, oriented x 3, intact and without gross motor defect    My assessment and plan as detailed above.  In addition, please note the following:    50yoF with HTN, DM and "heart failure" presenting with atypical chest/shoulder pain and HTN. With regards to her pain, it is atypical in nature and likely non-cardiac. She had a normal left heart cath February 2021. Can rule out officially with cardiac enzymes x 3. No plans for repeat ischemic evaluation at this time. No need to keep NPO unless troponin becomes positive. Defer additional work up/treatment to primary team. Will check echo given her heart failure history to ensure medications are optimized. Will increase Coreg to 6.25mg  BID given HTN. 12.5mg  BID was too high of a dose of her. If need additional BP control can transition Losartan to Valsartan. Will re-assess BP in AM. She is visiting from [place] but plans to remain local for at least a few months. Will arrange outpatient follow up after discharge.    Alvira Monday, MD       [facility]  APP Spectralink [phone] (8am-5pm)  MD Spectralink [phone] or [phone] (8am-5pm)  Arrhythmia Spectralink [phone] (8am-4:30pm)  Arrhythmia urgent consults or to reach the on-call EP MD [phone]  After hours, non urgent consult line [phone]  After Hours, urgent consults or to reach the on-call MD [phone]

## 2021-07-25 NOTE — ED Provider Notes (Shared)
[facility] EMERGENCY DEPARTMENT  ATTENDING SUPERVISORY NOTE     The patient was seen and examined by the mid-level and the plan of care was discussed with me. I agree with the plan as it was presented to me. I was present during key portions of any procedures performed.     Brief Assessment/Plan: 50 y.o. female w/ history of CHF, Afib present w/ 6 days of progressively worsening chest pain associated with shortness of breath and dizziness. She describes chest pressure radiating to her left arm. Denies cough, abdominal pain, nausea, vomiting, diarrhea.     Exam:    MDM:    O2 sat-           saturation: 99 %; Oxygen use: room air; Interpretation: Normal      EKG -             interpreted by me: low voltage, sinus rhythm at rate 71, no evidence of acute ischemia         Final diagnoses:   None      ED Disposition       None          New Prescriptions    No medications on file          I was acting as a scribe for Williemae Natter, MD on [facility] Curry  Aadarsh Cozort A. Allena Curry    I am the first provider for this patient and I personally performed the services documented. Rebecca Curry is scribing for me on Deans,Rebecca Curry. This note accurately reflects work and decisions made by me.  Williemae Natter, MD

## 2021-07-25 NOTE — Plan of Care (Signed)
Admission Assessment: CTUS     Admitted from: ED     Orientation: A&O x4   VSS/other: SBP 120's, RA, HR 70's, Afebrile   Rhythm on tele: SR  Ambulation: Independent  Lines/Drips: 18g RAC   GI/GU: Cont x2, LBM 8/23  Code Status: Fullcode  Fall Score: High ( felt dizzy )     Verified patient ID/arm bands. Ensured appropriate safety precautions in place including: assigned fall score interventions, review of known allergies, special needs, personal items within reach, and call bell within reach.      Critical Labs/Images/Procedures:  Trops: .01     Comments:  *See doc flow for complete assessment and vitals*     Plan:   - Vitals Q4H  - Monitor HR/Rhythm   - Pain management  - Following trops, next on 2035    Skin Assessment  Cosign Note:      Skin WNL EXCEPT for:    OTHER: Scattered tattoos and Scars

## 2021-07-25 NOTE — Plan of Care (Signed)
Plan of Care  [facility]   Faxed echo report from [facility] received  Left heart cath done 01/14/2020 by Dr. Novella Rob showed patent coronary arteries.  The left circumflex was nondominant.  Angiographically normal coronaries with only luminal irregularities.  LVEDP was 16 mmHg.  No significant pressure gradient across aortic valve on pullback back.    Front Information systems manager at [facility] [phone] say that she was never seen outpatient, and they have no echo report or consult note on file.

## 2021-07-25 NOTE — Progress Notes (Signed)
4 eyes in 4 hours pressure injury assessment note:      Completed with: Ed, RN   Unit & Time admitted: CTUS , 19:32              Bony Prominences: Check appropriate box; if wound is present enter wound assessment in LDA     Occiput:                 [x] WNL  []  Wound present  Face:                     [x] WNL  []  Wound present  Ears:                      [x] WNL  []  Wound present  Spine:                    [x] WNL  []  Wound present  Shoulders:             [x] WNL  []  Wound present  Elbows:                  [x] WNL  []  Wound present  Sacrum/coccyx:     [x] WNL  []  Wound present  Ischial Tuberosity:  [x] WNL  []  Wound present  Trochanter/Hip:      [x] WNL  []  Wound present  Knees:                   [x] WNL  []  Wound present  Ankles:                   [x] WNL  []  Wound present  Heels:                    [x] WNL  []  Wound present  Other pressure areas:  []  Wound location       Device related: []  Device name:         LDA completed if wound present: yes/no  Consult WOCN if necessary    Other skin related issues, ie tears, rash, etc, document in Integumentary flowsheet

## 2021-07-25 NOTE — ED Provider Notes (Cosign Needed)
[facility] EMERGENCY DEPARTMENT RESIDENT H&P       CLINICAL INFORMATION        HPI:      Chief Complaint: Chest Pain and Shortness of Breath  .    Rebecca Curry is a 50 y.o. female with history of A. fib, hypertension, CHF, and type 2 diabetes who presents with chest pain that started Thursday.  Patient reports that the pain was just a "discomfort", but now it is a central pressure that radiates down her left arm.  Also describes pain in back.  Denies tearing sensation.  Patient reports that she took her blood pressure at home which was in the systolic of 170s, but she took an extra dose of carvedilol.  Patient reports dyspnea on exertion worse than baseline.  Also complains of dizziness and lightheadedness with darkening of vision.  Also complains of abdominal fluid diarrhea today, but denies vomiting.  Patient also complains of right calf pain and minimal swelling.  Denies history of cancer, or blood clots.    History obtained from: patient          ROS:      Review of Systems   Constitutional:  Positive for fatigue. Negative for chills and fever.   HENT:  Negative for congestion, ear pain and sore throat.    Eyes:  Positive for visual disturbance. Negative for pain.   Respiratory:  Positive for shortness of breath. Negative for cough.    Cardiovascular:  Positive for chest pain and leg swelling.   Gastrointestinal:  Positive for nausea. Negative for abdominal pain, constipation, diarrhea and vomiting.   Genitourinary:  Negative for difficulty urinating, dysuria and urgency.   Musculoskeletal:  Positive for neck pain. Negative for arthralgias and joint swelling.   Skin:  Negative for color change and rash.   Neurological:  Positive for dizziness and light-headedness. Negative for headaches.       Physical Exam:      Pulse 88  BP 154/61  Resp 18  SpO2 99 %  Temp 98 F (36.7 C)    Physical Exam  Constitutional:       General: She is not in acute distress.  HENT:      Head:  Normocephalic and atraumatic.   Eyes:      Extraocular Movements: Extraocular movements intact.      Conjunctiva/sclera: Conjunctivae normal.   Cardiovascular:      Rate and Rhythm: Normal rate and regular rhythm.      Pulses: Normal pulses.      Heart sounds: Normal heart sounds. Heart sounds not distant. No murmur heard.  Pulmonary:      Effort: Pulmonary effort is normal. No respiratory distress.      Breath sounds: Normal breath sounds.   Abdominal:      General: Abdomen is flat. There is no distension.      Palpations: Abdomen is soft.   Musculoskeletal:         General: No deformity. Normal range of motion.      Cervical back: Normal range of motion and neck supple.      Right lower leg: Tenderness (Tenderness to palpation of distal calf) present. Edema present.      Left lower leg: No tenderness. No edema.   Skin:     General: Skin is warm and dry.   Neurological:      General: No focal deficit present.      Mental Status: She is alert and oriented to person,  place, and time.      Cranial Nerves: Cranial nerves are intact.      Sensory: Sensation is intact.      Motor: Motor function is intact.   Psychiatric:         Mood and Affect: Mood normal.         Behavior: Behavior normal.               PAST HISTORY        Primary Care Provider: Pcp, None, MD        PMH/PSH:    .     Past Medical History:   Diagnosis Date    Atrial fibrillation     CHF (congestive heart failure)     Diabetes mellitus        She has a past surgical history that includes Cesarean section and Hernia repair.      Social/Family History:      She reports that she has never smoked. She has never used smokeless tobacco. She reports that she does not drink alcohol and does not use drugs.    History reviewed. No pertinent family history.      Listed Medications on Arrival:    .     Home Medications       Med List Status: In Progress Set By: Ollen Bowl, RN at 07/25/2021 11:41 AM              carvedilol (COREG) 3.125 MG tablet     Take 3.125  mg by mouth     losartan (COZAAR) 100 MG tablet     Take 100 mg by mouth daily     spironolactone (ALDACTONE) 50 MG tablet     Take 50 mg by mouth daily           Allergies: She is allergic to latex, penicillins, and zofran [ondansetron].            VISIT INFORMATION        Reassessments/Clinical Course:      Patient is a 50 y.o. female with history of A. fib, hypertension, CHF, and type 2 diabetes who presents with chest pain and dyspnea.  Vital signs significant for hypertension.  Physical exam significant for right calf tenderness and minimal swelling right ankle.  Well's score of 3, moderate risk. HEART score of 4 (moderate), high risk via HEART pathway. Possibility of ACS, given symptoms of chest pressure and shortness of breath, radiating down left arm.  Will evaluate with EKG and troponin.  Possibility of PE, given the right calf tenderness, chest pain and shortness of breath.  To evaluate with CT abdomen and chest.  Aortic dissection less likely however considered given patient reporting radiating to back, but denies tearing chest pain, and pulses equal bilaterally.     ED Course as of 07/25/21 1516   Tue Jul 25, 2021   1133 XR Chest 2 Views  No acute cardiopulmonary process. [CW]   1133 CBC and differential  Unremarkable [CW]   1330 Comprehensive metabolic panel(!)  Unremarkable [CW]   1330 Lipase  Normal [CW]   1332 B-Natriuretic Peptide: 22  Normal BNP [CW]   1332 CT Angio Chest (PE or trauma protocol)  No evidence of pulmonary emboli. [CW]   1358 Accepted signout: 50 year old female history of CHF, A. fib, dementia, diabetes presenting with 5 days of radiating chest pain and right calf tenderness.  Work-up reassuring so far, follow-up bilateral venous Dopplers. [AB]   1515 Patient  admitted to medicine for additional workup, [facility] to see. [AB]      ED Course User Index  [AB] Cherrie Gauze, MD  [CW] Cheryl Flash, MD      Given that the patient is high risk based on the HEART pathway, due to  symptoms and history of hypertension, diabetes, and obesity, patient would benefit from inpatient observation.      Conversations with Other Providers:              Medications Given in the ED:    .     ED Medication Orders (From admission, onward)      Start Ordered     Status Ordering Provider    07/25/21 1322 07/25/21 1323  iohexol (OMNIPAQUE) 350 MG/ML injection 100 mL  IMG once as needed        Route: Intravenous  Ordered Dose: 100 mL     Last MAR action: Imaging Agent Given ZODDA, DAVID F              Procedures:      Procedures      Assessment/Plan:                    Cheryl Flash, MD  Resident  07/25/21 1351       Cheryl Flash, MD  Resident  07/25/21 1406       Cherrie Gauze, MD  Resident  07/25/21 [phone]

## 2021-07-25 NOTE — H&P (Addendum)
ADMISSION HISTORY AND PHYSICAL EXAM    Date Time: 07/25/21 3:04 PM  Patient Name: Rebecca Curry  Attending Physician: Monica Becton, MD  Primary Care Physician: Pcp, None, MD    CC: Chest pain      Assessment/Plan:   #Left Chest Pain  - Acute, suspect MSK vs psychogenic vs less likely cardiac  - Given CHF history, will check Echo for re-assessment  - Trial Toradol x 1 for MSK pain as muscle tightness/inflammation could cause radiculopathy  - EKG with low voltage, consideration of AL amyloid (L arm/hand paresthesias = carpel tunnel??. No evidence of renal or pulmonary involvement)  ---could consider trial cyclobenzaprine  ---Patient reportedly had bad reaction to benzodiazepine in the past making her "act manic"  - Patient describing chest pain at 1655. EKG obtained at that time showed normal sinus rhythm, no ST changes. Will obtain trop to evaluate for changes.  - Continue to monitor, trial lidoderm patch on L trapezius    #Left Arm Pain  - Suspect MSK etiology    #Hypertension  - Chronic, difficult to control at home, normotensive here  - Given patient taking 6.25 today and 4.2mg  the previous night (1.5 tab of 3.125), Will initiate 6.25mg  BID  - Hold Spironolactone as patient does not frequently take  - Differential to also include hypercortisolism (proximal myopathy? Difficult to control BP).   - Check late night salivary cortisol  - Continue Losartan  - Follow up AM BMP    #CHF: Reported  - Cardiology consulted by ED, recs appreciated  - f/u Echo  - Stress Test as per Cardiology directive  ---NPO at midnight in possibility for stress  - f/u TSH, HA1c, Lipid panel    #Afib: Reported, not on AC  - Normal sinus rhythm currently      Disposition: (Please see PAF column for Expected D/C Date)   Today's date: 07/25/2021  Admit Date: 07/25/2021 11:16 AM  Service status: Observation  Clinical Milestones: Improvement of chest discomfort, further evaluation of CHF, improved BP control  Anticipated discharge needs:  Needs new PCP with follow up in 1 week     History of Presenting Illness:   Rebecca Curry is a 50 y.o. female with history of CHF, Afib not on [facility], who presents to the hospital with chest pain. History obtained from patient and ED records.    Patient was in her previous state of health until July 2022 when she stated that she would have intermittent chest fullness whenever her blood pressures would be high (either systolic or diastolic). She does report feeling anxious about them. Over the past 3-4 days, she states that her blood pressures have been more difficult to control. She admits to taking 1/2 extra Coreg last night and an extra 1/2 coreg today for her blood pressures. She describes a feeling of "neck, upper back, left arm" fullness with paresthesias/numbness that has been pretty constant involving the entire anterior and posterior arm, 5/10 in severity, with some associated shortness of breath when she was walking in the ED today. Due to her ongoing symptoms, and concern for a heart attack, she presented to the ED. She denies any recent heavy lifting, no precipitating factors besides maybe high blood pressures that would bring on the fullness/discomfort, no recent illness. No N/V/Abd pain/diarrhea.     Of note, she is from [place] and will be here for a few more months. She has a friend who is her PCP who has been prescribing Coreg 3.125 1.5  pills in the AM, 1 pill at night, Spironolactone 50mg  as needed for "swelling" and Losartan 100mg . She tells me she had a RHC that was performed in 01/2020 which did not demonstrate a heart attack. In 2010, she reports she was hospitalized in [place] for chest pain and SOB and was told she has "congestive heart failure" . She states she was started on a lot of medications, which she had low blood pressure and ended up in the ED. Her PCP had since discontinued many of the medications. Patient also reports previously weighing >430lbs, but losing weight with lifestyle  modifications.     In the ED, patient was VSS. Initial labs significant for normal CBC, normal CMP, Troponin negative, BNP negative. EKG personally reviewed showed low voltage, but sinus rhythm. CTA PE protocol personally reviewed demonstrates no segmental PE, no active pulmonary disease. No evidence of subclavian abnormalities.     Of note, patient was seen in the ED on 06/09/21 for similar complaints of chest pain in the setting of elevated BP systolic 240s.     Past Medical History:     Past Medical History:   Diagnosis Date    Atrial fibrillation     CHF (congestive heart failure)     Diabetes mellitus        Available old records reviewed, including:   ED note 06/09/2021    Past Surgical History:     Past Surgical History:   Procedure Laterality Date    CESAREAN SECTION      x2    HERNIA REPAIR         Family History:   Reviewed and Noncontributory    Social History:     Social History     Tobacco Use   Smoking Status Never   Smokeless Tobacco Never     Social History     Substance and Sexual Activity   Alcohol Use Never     Social History     Substance and Sexual Activity   Drug Use Never       Allergies:     Allergies   Allergen Reactions    Latex     Penicillins     Zofran [Ondansetron] Headaches       Medications:     Home Medications       Med List Status: In Progress Set By: Ollen Bowl, RN at 07/25/2021 11:41 AM              carvedilol (COREG) 3.125 MG tablet     Take 3.125 mg by mouth     losartan (COZAAR) 100 MG tablet     Take 100 mg by mouth daily     spironolactone (ALDACTONE) 50 MG tablet     Take 50 mg by mouth daily              Method by which medications were confirmed on admission: Patient    Review of Systems:   All other systems were reviewed and are negative except as documented in HPI    Physical Exam:   Patient Vitals for the past 24 hrs:   BP Temp Temp src Pulse Resp SpO2 Height Weight   07/25/21 1230 132/78 -- -- 61 16 97 % -- --   07/25/21 1200 128/82 -- -- 65 18 99 % -- --   07/25/21  1026 154/61 98 F (36.7 C) Oral 75 18 99 % 1.651 m (5\' 5" ) 95.3 kg (210 lb)   07/25/21 1002 -- -- --  88 -- 99 % -- --     Body mass index is 34.95 kg/m.  No intake or output data in the 24 hours ending 07/25/21 1504    General: Awake, Alert, Oriented x 3; in no acute distress.  HEENT: +Xanthelasma, Pupils equal, round, reactive to light, extra ocular movements intact, sclera anicteric, oropharynx clear without lesions, mucous membranes moist  Neck: Supple, no lymphadenopathy, no thyromegaly, no JVD, no carotid bruits, possible hump  Cardiovascular: Regular rate and rhythm, no murmurs, rubs or gallops  Lungs: Clear to auscultation bilaterally, without wheezing, rhonchi, or rales  Abdomen: Soft, non-tender, non-distended; no palpable masses, no hepatosplenomegaly, normoactive bowel sounds, no rebound or guarding  Extremities: No clubbing, cyanosis, or edema  Neuro: Cranial nerves grossly intact, strength 5/5 in upper and lower extremities, sensation intact.  Skin: No rashes or lesions noted        Labs:     Results       Procedure Component Value Units Date/Time    B-type Natriuretic Peptide [[id number]] Collected: 07/25/21 1041    Specimen: Blood Updated: 07/25/21 1332     B-Natriuretic Peptide 22 pg/mL     i-stat Whole Blood B-HCG, Total [[id number]] Collected: 07/25/21 1245     Updated: 07/25/21 1257     i-stat Whole Blood B-HCG, Total <5.0 IU/L     Comprehensive metabolic panel [[id number]]  (Abnormal) Collected: 07/25/21 1041    Specimen: Blood Updated: 07/25/21 1145     Glucose 105 mg/dL      BUN 78.4 mg/dL      Creatinine 0.6 mg/dL      Sodium 696 mEq/L      Potassium 4.4 mEq/L      Chloride 106 mEq/L      CO2 26 mEq/L      Calcium 9.4 mg/dL      Protein, Total 7.1 g/dL      Albumin 3.9 g/dL      AST (SGOT) 15 U/L      ALT 17 U/L      Alkaline Phosphatase 74 U/L      Bilirubin, Total 0.4 mg/dL      Globulin 3.2 g/dL      Albumin/Globulin Ratio 1.2     Anion Gap 5.0    Lipase [[id number]] Collected: 07/25/21 1041     Specimen: Blood Updated: 07/25/21 1145     Lipase 19 U/L     GFR [[id number]] Collected: 07/25/21 1041     Updated: 07/25/21 1145     EGFR >60.0       Troponin I [[id number]] Collected: 07/25/21 1041    Specimen: Blood Updated: 07/25/21 1114     Troponin I <0.01 ng/mL     CBC and differential [[id number]] Collected: 07/25/21 1041    Specimen: Blood Updated: 07/25/21 1057     WBC 6.69 x10 3/uL      Hgb 13.1 g/dL      Hematocrit 56.4 %      Platelets 281 x10 3/uL      RBC 4.57 x10 6/uL      MCV 86.7 fL      MCH 28.7 pg      MCHC 33.1 g/dL      RDW 14 %      MPV 9.2 fL      Neutrophils 61.9 %      Lymphocytes Automated 30.2 %      Monocytes 6.0 %      Eosinophils Automated 1.0 %  Basophils Automated 0.6 %      Immature Granulocytes 0.3 %      Nucleated RBC 0.0 /100 WBC      Neutrophils Absolute 4.14 x10 3/uL      Lymphocytes Absolute Automated 2.02 x10 3/uL      Monocytes Absolute Automated 0.40 x10 3/uL      Eosinophils Absolute Automated 0.07 x10 3/uL      Basophils Absolute Automated 0.04 x10 3/uL      Immature Granulocytes Absolute 0.02 x10 3/uL      Absolute NRBC 0.00 x10 3/uL             Imaging personally reviewed, including: EKG, CTA PE    Safety Checklist  DVT prophylaxis:  CHEST guideline (See page [id number]) Chemical   Foley:  [place] Rn Foley protocol Not present   IVs:  Peripheral IV   PT/OT: Not needed   Daily CBC & or Chem ordered:  SHM/ABIM guidelines (see #5) No   Reference for approximate charges of common labs: CBC auto diff - $76  BMP - $99  Mg - $79    Signed by: Monica Becton, MD   cc:Pcp, None, MD

## 2021-07-26 ENCOUNTER — Observation Stay (HOSPITAL_BASED_OUTPATIENT_CLINIC_OR_DEPARTMENT_OTHER): Payer: Self-pay

## 2021-07-26 DIAGNOSIS — R0789 Other chest pain: Secondary | ICD-10-CM

## 2021-07-26 DIAGNOSIS — I509 Heart failure, unspecified: Secondary | ICD-10-CM

## 2021-07-26 HISTORY — PX: ECHOCARDIOGRAM, TRANSTHORACIC: SHX3784

## 2021-07-26 LAB — BASIC METABOLIC PANEL
Anion Gap: 6 (ref 5.0–15.0)
BUN: 16 mg/dL (ref 7.0–19.0)
CO2: 26 mEq/L (ref 22–29)
Calcium: 9.4 mg/dL (ref 8.5–10.5)
Chloride: 106 mEq/L (ref 100–111)
Creatinine: 0.6 mg/dL (ref 0.6–1.0)
Glucose: 101 mg/dL — ABNORMAL HIGH (ref 70–100)
Potassium: 4.2 mEq/L (ref 3.5–5.1)
Sodium: 138 mEq/L (ref 136–145)

## 2021-07-26 LAB — ECHOCARDIOGRAM ADULT COMPLETE W CLR/ DOPP WAVEFORM
AV Area (Cont Eq VTI): 3.159
AV Area (Cont Eq VTI): 3.16
AV Mean Gradient: 4
AV Peak Velocity: 148
Ao Root Diameter (2D): 3.4
BP Mod LV Ejection Fraction: 77.9
IVS Diastolic Thickness (2D): 1.1
LA Dimension (2D): 3.9
LA Volume Index (BP A-L): 0.026
LVID diastole (2D): 4.2
LVID systole (2D): 2.5
MV Area (PHT): 3.993
MV E/A: 0.9
MV E/A: 0.928
MV E/e' (Average): 10.759
Mitral Valve Findings: NORMAL
Prox Ascending Aorta Diameter: 3.5
Pulmonary Valve Findings: NORMAL
RV Basal Diastolic Dimension: 3.8
RV Function: NORMAL
Site RA Size (AS): NORMAL
Site RV Size (AS): NORMAL
TAPSE: 2.17
Tricuspid Valve Findings: NORMAL

## 2021-07-26 LAB — ECG 12-LEAD
Atrial Rate: 70 {beats}/min
IHS MUSE NARRATIVE AND IMPRESSION: NORMAL
P Axis: 46 degrees
P-R Interval: 184 ms
Q-T Interval: 408 ms
QRS Duration: 74 ms
QTC Calculation (Bezet): 440 ms
R Axis: 34 degrees
T Axis: 41 degrees
Ventricular Rate: 70 {beats}/min

## 2021-07-26 LAB — GFR: EGFR: >60

## 2021-07-26 MED ORDER — ACETAMINOPHEN 325 MG PO TABS
650.0000 mg | ORAL_TABLET | Freq: Four times a day (QID) | ORAL | Status: DC | PRN
Start: 2021-07-26 — End: 2021-07-26
  Administered 2021-07-26: 650 mg via ORAL
  Filled 2021-07-26: qty 2

## 2021-07-26 MED ORDER — CARVEDILOL 6.25 MG PO TABS
6.2500 mg | ORAL_TABLET | Freq: Two times a day (BID) | ORAL | 0 refills | Status: DC
Start: 2021-07-26 — End: 2021-08-08

## 2021-07-26 MED ORDER — VALSARTAN 160 MG PO TABS
160.0000 mg | ORAL_TABLET | Freq: Every day | ORAL | 0 refills | Status: DC
Start: 2021-07-26 — End: 2021-08-08

## 2021-07-26 NOTE — Plan of Care (Signed)
Problem: Safety  Goal: Patient will be free from injury during hospitalization  Outcome: Progressing  Goal: Patient will be free from infection during hospitalization  Outcome: Progressing     Problem: Pain  Goal: Pain at adequate level as identified by patient  Outcome: Progressing     Problem: Side Effects from Pain Analgesia  Goal: Patient will experience minimal side effects of analgesic therapy  Outcome: Progressing     Problem: Discharge Barriers  Goal: Patient will be discharged home or other facility with appropriate resources  Outcome: Progressing     Problem: Psychosocial and Spiritual Needs  Goal: Demonstrates ability to cope with hospitalization/illness  Outcome: Progressing     Problem: Moderate/High Fall Risk Score >5  Goal: Patient will remain free of falls  Outcome: Progressing

## 2021-07-26 NOTE — Progress Notes (Signed)
[facility] PROGRESS NOTE  [facility]      Date Time: 07/26/21 11:15 AM  Patient Name: Rebecca Curry, Rebecca Curry  Medical Record #:  [id number]  Account#:  [id number]  Admission Date:  07/25/2021         Patient Active Problem List   Diagnosis    Chest pain       Subjective:   No acute events overnight.  Continues to have constant chest pain, no significant dyspnea.  Patient is anxious and tearful during interview.      Assessment:   Chest pain, atypical and typical features, serial negative troponins in the face of unrelenting pain for days  CTA chest no PE  CHF unclear EF, underwent cardiac workup in [place] 2021 with LHC that patient reports showed no significant CAD, appears largely euvolemic here and BNP normal.  Echocardiogram pending today.  LHC 01/14/2020 with angiographically-normal coronary arteries with only mild luminal irregularities.  LVEDP 16 mmHg.  DM, uncontrolled for many years, now diet controlled reportedly  HTN, uncontrolled at home, here 120s-150s  Obesity BMI 34.9 with significant wt loss through diet alone 200 lb in last several years  60 pack year smoking history (2ppd), now abstinent  Family hx premature CAD half-brother MI in 86s, paternal grandmother first MI in 30s        Recommendations:   Reassured that chest pain is unlikely ischemic given negative enzymes and recent LHC with minimal epicardial CAD.  Would defer checking any additional troponins.  Encouraged ongoing weight loss.  BP remains elevated.  Would transition from losartan to valsartan 160 mg daily at discharge.  This could be uptitrated as needed as outpatient to achieve goal BP < 130/80.  Continue current dose of carvedilol.  Follow up echocardiogram results.  Appropriate for discharge from cardiology perspective barring significant abnormality on echocardiogram.  Patient expects to be in the area only temporarily.  Can follow up in office next available if she has not moved by that time.        Medications:       Scheduled Meds:    carvedilol, 6.25 mg, Oral, Q12H SCH  enoxaparin, 40 mg, Subcutaneous, Daily  lidocaine, 1 patch, Transdermal, Q24H  losartan, 50 mg, Oral, Q12H SCH        Continuous Infusions:           Physical Exam:       VITAL SIGNS PHYSICAL EXAM   Vitals:    07/26/21 0831   BP: (!) 157/109   Pulse: 76   Resp:    Temp:    SpO2:      Temp (24hrs), Avg:97.8 F (36.6 C), Min:97.6 F (36.4 C), Max:98.1 F (36.7 C)      Telemetry: 4 beats NSVT    No intake or output data in the 24 hours ending 07/26/21 1115 Physical Exam  General: awake, alert, breathing comfortably, no acute distress  Head: normocephalic  Eyes: EOM's intact  Cardiovascular: regular rate and rhythm, normal S1, S2, no S3, no S4, no murmurs, rubs or gallops  Neck: no carotid bruits or JVD  Lungs: clear to auscultation bilaterally, without wheezing, rhonchi, or rales  Abdomen: soft, non-tender, non-distended; no palpable masses,  normoactive bowel sounds  Extremities: no edema  Pulse: equal pulses, 4/4 symmetric  Neurological: Alert and oriented X3, mood and affect normal  Musculoskeletal: normal strength and tone         Labs:     Recent Labs   Lab 07/25/21  2039 07/25/21  1735 07/25/21  1041   Troponin I <0.01 <0.01 <0.01         Recent Labs   Lab 07/25/21  1041   Cholesterol 157   Triglycerides 101   HDL 41   LDL Calculated 96     Recent Labs   Lab 07/25/21  1041   Bilirubin, Total 0.4   Protein, Total 7.1   Albumin 3.9   ALT 17   AST (SGOT) 15             Recent Labs   Lab 07/25/21  1041   WBC 6.69   Hgb 13.1   Hematocrit 39.6   Platelets 281     Recent Labs   Lab 07/26/21  0327 07/25/21  1041   Sodium 138 137   Potassium 4.2 4.4   Chloride 106 106   CO2 26 26   BUN 16.0 10.0   Creatinine 0.6 0.6   EGFR >60.0 >60.0   Glucose 101* 105*   Calcium 9.4 9.4     Recent Labs   Lab 07/25/21  1041   TSH 1.19       .  Lab Results   Component Value Date    BNP 22 07/25/2021        Weight Monitoring 06/09/2021 07/25/2021 07/26/2021   Height 165.1 cm 165.1  cm -   Height Method Stated Stated -   Weight 106.595 kg 95.255 kg 104.01 kg   Weight Method Stated Stated Standing Scale   BMI (calculated) 39.2 kg/m2 35 kg/m2 -       Imaging:       ____________________________________________    Signed by: Verner Mould, MD      [facility]  APP Spectralink [phone] (8am-5pm)  MD Spectralink [phone] or [phone] (8am-5pm)  Arrhythmia Spectralink [phone] (8am-4:30pm)  Arrhythmia urgent consults or to reach the on-call MD [phone]  After hours, non urgent consult line [phone]  After Hours, urgent consults or to reach the on-call MD [phone]

## 2021-07-26 NOTE — Plan of Care (Signed)
Discharge Note:      Orientation: A&O x4  Rhythm on tele:  SR  Oxygen:  RA  Ambulation:  Indep  Pain:  mild L-sided CP managed w/prn Tylenol  Lines/Drips: PIV removed  GI/GU:  Contx2  Fall Score:  Low       Comments: Discharge instructions + a copy of AVS given at bedside.  All questions answered.  Pt is agreeable to this discharge.  All personal property were removed by the patient.  Pt remains A&O x4, hemodynamically stable & free from falls or injury during this admission.

## 2021-07-26 NOTE — Discharge Summary (Signed)
MEDICINE HOSPITALIST DISCHARGE SUMMARY    Date Time: 07/26/21 11:52 AM  Patient Name: Rebecca Curry  Attending Physician: Durene Romans, DO    Date of Admission:   07/25/2021    Date of Discharge:   07/26/2021    Reason for Admission:   Chest pain [R07.9]    Problems:   Lists the present on admission hospital problems  Present on Admission:   Chest pain        Problem Lists:  Patient Active Problem List   Diagnosis    Chest pain       Discharge Dx:   Chest pain [R07.9]    Consultations:   Treatment Team: Attending Provider: Durene Romans, DO; Registered Nurse: Mamie Laurel, RN; Resident: Cherrie Gauze, MD; Registered Nurse: Pleas Koch, RN    Procedures performed:     US Venous Duplex Doppler Leg Bilateral   Final Result       No sonographic evidence for right or left lower extremity deep venous   thrombosis.      Gerlene Burdock, MD    07/25/2021 5:39 PM      CT Angio Chest (PE or trauma protocol)   Final Result         No evidence of pulmonary emboli.      Georgiana Spinner, MD    07/25/2021 1:30 PM      XR Chest 2 Views   Final Result      No acute cardiopulmonary process.      August Albino    07/25/2021 11:24 AM      Echocardiogram Adult Complete W Clr/ Dopp Waveform    (Results Pending)       Presenting history and hospital Course:   HPI per admitting MD  "Rebecca Curry is a 50 y.o. female with history of CHF, Afib not on [facility], who presents to the hospital with chest pain. History obtained from patient and ED records.     Patient was in her previous state of health until July 2022 when she stated that she would have intermittent chest fullness whenever her blood pressures would be high (either systolic or diastolic). She does report feeling anxious about them. Over the past 3-4 days, she states that her blood pressures have been more difficult to control. She admits to taking 1/2 extra Coreg last night and an extra 1/2 coreg today for her blood pressures. She describes a feeling of "neck, upper back, left arm" fullness with  paresthesias/numbness that has been pretty constant involving the entire anterior and posterior arm, 5/10 in severity, with some associated shortness of breath when she was walking in the ED today. Due to her ongoing symptoms, and concern for a heart attack, she presented to the ED. She denies any recent heavy lifting, no precipitating factors besides maybe high blood pressures that would bring on the fullness/discomfort, no recent illness. No N/V/Abd pain/diarrhea.      Of note, she is from [place] and will be here for a few more months. She has a friend who is her PCP who has been prescribing Coreg 3.125 1.5 pills in the AM, 1 pill at night, Spironolactone 50mg  as needed for "swelling" and Losartan 100mg . She tells me she had a RHC that was performed in 01/2020 which did not demonstrate a heart attack. In 2010, she reports she was hospitalized in [place] for chest pain and SOB and was told she has "congestive heart failure" . She states she was started on a  lot of medications, which she had low blood pressure and ended up in the ED. Her PCP had since discontinued many of the medications. Patient also reports previously weighing >430lbs, but losing weight with lifestyle modifications.      In the ED, patient was VSS. Initial labs significant for normal CBC, normal CMP, Troponin negative, BNP negative. EKG personally reviewed showed low voltage, but sinus rhythm. CTA PE protocol personally reviewed demonstrates no segmental PE, no active pulmonary disease. No evidence of subclavian abnormalities.      Of note, patient was seen in the ED on 06/09/21 for similar complaints of chest pain in the setting of elevated BP systolic 240s. "    Hospital course  She had CTA of chest done which showed negative for PE.  Her lower extremity ultrasound was negative for DVT.  Her serial troponins check was normal.  She was seen by cardiologist here and recommended to control her blood pressure with Coreg and valsartan at discharge. Her  chest pain is atypical and not likely related to cardiac in nature.    I had advised the patient the importance of following up closely with outpatient specialists and patient's PCP for continued management patient's disease status. Patient understood the instructions and appreciate the care that we have given. Pt is medically and neurologically stable to discharge home.         Physical exam at discharge:  Vitals:    07/26/21 1118   BP: 123/85   Pulse: 81   Resp:    Temp: 98.6 F (37 C)   SpO2: 96%     General: awake, alert, oriented x 3; no acute distress.  HEENT: perrla, eomi, sclera anicteric  oropharynx clear without lesions, mucous membranes moist  Neck: supple, no lymphadenopathy, no thyromegaly, no JVD, no carotid bruits  Cardiovascular: regular rate and rhythm, no murmurs, rubs or gallops  Lungs: clear to auscultation bilaterally, without wheezing, rhonchi, or rales  Abdomen: soft, non-tender, non-distended; no palpable masses, no hepatosplenomegaly, normoactive bowel sounds, no rebound or guarding  Extremities: no clubbing, cyanosis, or edema  Neuro: cranial nerves grossly intact, strength 5/5 in upper and lower extremities, sensation intact  Skin: no rashes or lesions noted    Discharge Medications:        Medication List        START taking these medications      valsartan 160 MG tablet  Commonly known as: DIOVAN  Take 1 tablet (160 mg total) by mouth daily            CHANGE how you take these medications      carvedilol 6.25 MG tablet  Commonly known as: COREG  Take 1 tablet (6.25 mg total) by mouth every 12 (twelve) hours  What changed:   medication strength  how much to take  when to take this            STOP taking these medications      losartan 100 MG tablet  Commonly known as: COZAAR     spironolactone 50 MG tablet  Commonly known as: ALDACTONE               Where to Get Your Medications        These medications were sent to [facility] [address] [place] - [address]  [address]  [place] [address]      Phone: [phone]   carvedilol 6.25 MG tablet  valsartan 160 MG tablet  Discharge Instructions:   Verner Mould, MD  [address]  [place] [address]  [phone]    Follow up in 1 week(s)  chest pain follow up          TIME SPENT:   On discharge and care coordination is 35 minutes. I have spent a large amount of time explaining everything to the patient and family at bedside, going over all the details of discharge diagnoses and the follow up plan.   Signed by: Durene Romans, DO, FACP    CC to: Pcp, None, MD

## 2021-07-26 NOTE — UM Notes (Signed)
07/25/21 1515  Adult Admit to Observation  Once        Diagnosis: Chest Pain    Level of Care: Acute    Patient Class: Observation            UNIT: Cardiac Telemetry    CC: Chest pain    A 50 y.o. female with history of CHF, Afib not on [facility], who presents to the hospital with chest pain. Patient was in her previous state of health until July 2022 when she stated that she would have intermittent chest fullness whenever her blood pressures would be high (either systolic or diastolic). She does report feeling anxious about them. Over the past 3-4 days, she states that her blood pressures have been more difficult to control. She admits to taking 1/2 extra Coreg last night and an extra 1/2 coreg today for her blood pressures. In the ED, patient was VSS. Initial labs significant for normal CBC, normal CMP, Troponin negative, BNP negative. EKG personally reviewed showed low voltage, but sinus rhythm. CTA PE protocol personally reviewed demonstrates no segmental PE, no active pulmonary disease. No evidence of subclavian abnormalities. Of note, patient was seen in the ED on 06/09/21 for similar complaints of chest pain in the setting of elevated BP systolic 240s.    VS--BP 154/61  P 75  T 98  R 18  SpO2 99% (RA)  BMI 34.95 kg/m.    ECG:    NORMAL SINUS RHYTHM   LOW VOLTAGE QRS   SEPTAL INFARCT , AGE UNDETERMINED        CXR--No acute cardiopulmonary process    CTA CHEST--No evidence of pulmonary emboli.    US VENOUS DOPP BIL LEG--No sonographic evidence for right or left lower extremity deep venous  thrombosis    ABN LABS--GLU 105      PLAN:  #Left Chest Pain  - Acute, suspect MSK vs psychogenic vs less likely cardiac  - Given CHF history, will check Echo for re-assessment  - Trial Toradol x 1 for MSK pain as muscle tightness/inflammation could cause radiculopathy  - EKG with low voltage, consideration of AL amyloid (L arm/hand paresthesias = carpel tunnel??. No evidence of renal or pulmonary involvement)  ---could consider  trial cyclobenzaprine  ---Patient reportedly had bad reaction to benzodiazepine in the past making her "act manic"  - Patient describing chest pain at 1655. EKG obtained at that time showed normal sinus rhythm, no ST changes. Will obtain trop to evaluate for changes.  - Continue to monitor, trial lidoderm patch on L trapezius     #Left Arm Pain  - Suspect MSK etiology     #Hypertension  - Chronic, difficult to control at home, normotensive here  - Given patient taking 6.25 today and 4.2mg  the previous night (1.5 tab of 3.125), Will initiate 6.25mg  BID  - Hold Spironolactone as patient does not frequently take  - Differential to also include hypercortisolism (proximal myopathy? Difficult to control BP).   - Check late night salivary cortisol  - Continue Losartan  - Follow up AM BMP     #CHF: Reported  - Cardiology consulted by ED, recs appreciated  - f/u Echo  - Stress Test as per Cardiology directive  ---NPO at midnight in possibility for stress  - f/u TSH, HA1c, Lipid panel     #Afib: Reported, not on AC  - Normal sinus rhythm currently    ++++++++++++++++++++++++++++++++++++++++  CSR for 07/26/2021    VS--BP 135/81  P 74  T 97.9  R 18  SpO2 98% (RA)    ABN LABS--    PLAN:  Plan for discharge today    UTILIZATION REVIEW CONTACT: Name: Kathlene Cote RNC BSN  Clinical Case Manager - Utilization Review   [facility] --[employer]  [address]  [place], [address] [place], [place] [address]  NPI: [id number]   Tax ID: [id number]   Phone: [phone]  Fax: [phone]     NOTES TO REVIEWER:    This clinical review is based on/compiled from documentation provided by the treatment team within the patients medical record.

## 2021-07-26 NOTE — Progress Notes (Signed)
07/26/21 1504   Discharge Disposition   Patient preference/choice provided? N/A   Physical Discharge Disposition Home   Mode of Transportation Car  (Patient will drive self back home)   Patient/Family/POA notified of transfer plan Yes   Patient agreeable to discharge plan/expected d/c date? Yes   Bedside nurse notified of transport plan? Yes   Outpatient Services   Services Other (comment)  ([facility])   CM Interventions   Follow up appointment scheduled? Yes   Is this a Medicare focused or COVID patient? No   Is this appointment within 48-72 hours? No   Referral made for home health RN visit? Does not meet home bound criteria   Multidisciplinary rounds/family meeting before d/c? Yes   Medicare Checklist   Is this a Medicare patient? No   Patient has no insurance or PCP. [facility] referral made. Patient has an appointment in 1 week and is aware to follow up for correct medication management.   Patient will drive herself home from the hospital.    Lenor Derrick [facility]  Case manager  [facility]

## 2021-07-26 NOTE — Progress Notes (Signed)
Initial Case Management Assessment and Discharge Planning  [facility]   Patient Name: Rebecca Curry, Rebecca Curry   Date of Birth Feb 20, 1971   Attending Physician: Durene Romans, DO   Primary Care Physician: Pcp, None, MD   Length of Stay 0   Reason for Consult / Chief Complaint Chest Pain        Situation   Admission DX:   1. Chest pain        A/O Status: X 3    LACE Score: 5    Patient admitted from: ER  Admission Status: observation    Health Care Agent: Self  Name: Rebecca Curry  Phone number: [phone]       Background     Advanced directive:   <no information>    Code Status:   Full Code     Residence: One story home/apartment    PCP: PCP None, MD  Patient Contact:   [phone] (home)     [phone] (mobile)     Emergency contact:   Extended Emergency Contact Information  Primary Emergency Contact: Rebecca Curry  Mobile Phone: [phone]  Relation: Friend  Preferred language: English      ADL/IADL's: Independent  Previous Level of function: 7 Independent     DME: None    Pharmacy:     [facility] [address] [place] - [address]  [address]  [place] [address]  Phone: [phone] Fax: [phone]      Prescription Coverage: Yes    Home Health: The patient is not currently receiving home health services.    Previous SNF/AR: n/a    COVID Vaccine Status: 2 vaccines    Date First IMM given: n/a  UAI on file?: No  Transport for discharge? Mode of transportation: Sales executive - Family/Friend to drive patient  Agreeable to Home with family post-discharge:  Yes     Assessment   Assessment completed at bedside/over the phone with patient/spouse/daughter/son.    BARRIERS TO DISCHARGE: ECHO     Recommendation   D/C Plan A: Home with family    D/C Plan B: Home with family    D/C Plan C: Home with home health       Patient is here visiting for a few months . She has no insurance or PCP. Offered [facility] program and the patient accepted. Explained better to have consistency with  appropriate medications for improved outcomes. Patient is aware she will need to establish with a PCP when she returns to [place].  Independent in all activities, drives and no discharge needs anticipated.     Rebecca Curry [facility]  Case manager  [facility]

## 2021-07-26 NOTE — Discharge Instr - AVS First Page (Addendum)
Reason for your Hospital Admission:  Chest pain, atypical negative for MI      Instructions for after your discharge:  Follow up with providers listed below

## 2021-08-02 ENCOUNTER — Encounter (INDEPENDENT_AMBULATORY_CARE_PROVIDER_SITE_OTHER): Payer: Self-pay

## 2021-08-04 ENCOUNTER — Ambulatory Visit (INDEPENDENT_AMBULATORY_CARE_PROVIDER_SITE_OTHER): Payer: Self-pay | Admitting: Cardiology

## 2021-08-08 ENCOUNTER — Encounter (INDEPENDENT_AMBULATORY_CARE_PROVIDER_SITE_OTHER): Payer: Self-pay | Admitting: Nurse Practitioner

## 2021-08-08 ENCOUNTER — Ambulatory Visit (INDEPENDENT_AMBULATORY_CARE_PROVIDER_SITE_OTHER): Payer: Self-pay | Admitting: Nurse Practitioner

## 2021-08-08 VITALS — BP 120/70 | HR 70 | Ht 65.5 in | Wt 230.0 lb

## 2021-08-08 DIAGNOSIS — R079 Chest pain, unspecified: Secondary | ICD-10-CM

## 2021-08-08 DIAGNOSIS — R42 Dizziness and giddiness: Secondary | ICD-10-CM

## 2021-08-08 DIAGNOSIS — G4710 Hypersomnia, unspecified: Secondary | ICD-10-CM

## 2021-08-08 DIAGNOSIS — E119 Type 2 diabetes mellitus without complications: Secondary | ICD-10-CM

## 2021-08-08 DIAGNOSIS — I10 Essential (primary) hypertension: Secondary | ICD-10-CM

## 2021-08-08 DIAGNOSIS — R002 Palpitations: Secondary | ICD-10-CM

## 2021-08-08 MED ORDER — VALSARTAN 160 MG PO TABS
160.0000 mg | ORAL_TABLET | Freq: Every day | ORAL | 3 refills | Status: AC
Start: 2021-08-08 — End: ?

## 2021-08-08 NOTE — Progress Notes (Signed)
[facility] HEART CARDIOLOGY OFFICE PROGRESS NOTE    [facility] HE[facility] [place] [facility] -CARDIOLOGY  [address]  [place] [address]  Dept: [phone]  Dept Fax: [phone]       Patient Name: Rebecca Curry    Date of Visit:  August 08, 2021  Date of Birth: 09-Oct-1971  AGE: 50 y.o.  Medical Record #: [id number]  Requesting Physician: PCP None, MD      CHIEF COMPLAINT: Chest Pain      HISTORY OF PRESENT ILLNESS:    She is a pleasant 50 y.o. female who presents today for hospital follow-up.  Patient was seen at [facility] on August 23 due to chest pain.  It was deemed atypical.  ACS was ruled out with negative troponin and EKG.  Patient also had a CTA of the chest which was negative for PE.  Patient was found to be hypertensive so her carvedilol was increased to 6.25 mg twice a day.  Patient states after 3 days she actually decreased her carvedilol back to 3.125 mg twice a day due to low blood pressure with systolic in the 80s to 90s.  Patient's BP at home now is averaging in the 120s to 130s over 70s to 80s.  Patient states chest pain has now resolved.  Patient's main complaint today is dizziness which occurs at random times.  Patient states it can occur with positional change but also during her daily 2 mile walk.  Patient states that she feels palpitation during these lightheadedness episodes.  Patient states palpitations feels like skipped beats followed by accelerated beats.  EKG today shows sinus rhythm with low voltage no PVCs or PACs and no acute ST-T wave changes.  Patient is not orthostatic with her standing blood pressure measured at 120/78.  Patient's other complaint is constant fatigue and drowsiness.  Patient states even after sleeping for restful night she will wake up feeling drowsy and tired.  Patient states that she does snore and does wake up couple times per night.  Patient has not had a sleep study in the past.  Patient had an echo in the hospital which showed  hyperdynamic EF 78%, normal LV size, normal RV size and function, mild AI, small apical pericardial effusion 0.8 cm thickness, no cardiac tamponade.      PAST MEDICAL HISTORY: She has a past medical history of Atrial fibrillation, CHF (congestive heart failure), and Diabetes mellitus. She has a past surgical history that includes Cesarean section; Hernia repair; Cardiac catheterization (Left, 2021); ECHOCARDIOGRAM, TRANSTHORACIC (07/26/2021); CTA of chest (07/25/2021); and US VENOUS ULTRASOUND LE/UE (07/25/2021).    ALLERGIES:   Allergies   Allergen Reactions    Ativan [Lorazepam]     Latex     Penicillins     Zofran [Ondansetron] Headaches       MEDICATIONS:   Patient's current medications were reviewed. ONLY Cardiac medications were updated unless others were addressed in assessment and plan.    Current Outpatient Medications:     carvedilol (COREG) 3.125 MG tablet, Take 3.125 mg by mouth, Disp: , Rfl:     valsartan (DIOVAN) 160 MG tablet, Take 1 tablet (160 mg total) by mouth daily, Disp: 30 tablet, Rfl: 0     FAMILY HISTORY: family history includes Diabetes in her father; Heart attack in her brother and maternal grandmother; Hypertension in her father.    SOCIAL HISTORY: She reports that she has never smoked. She has never used smokeless tobacco. She reports that  she does not drink alcohol and does not use drugs.    PHYSICAL EXAMINATION    Visit Vitals  BP 120/70 (BP Site: Left arm, Patient Position: Sitting, Cuff Size: Large)   Pulse 70   Ht 1.664 m (5' 5.5")   Wt 104.3 kg (230 lb)   LMP 07/20/2021   BMI 37.69 kg/m       General Appearance:  A well-appearing female in no acute distress.    Skin: Warm and dry to touch, no apparent skin lesions, or masses noted.  Head: Normocephalic, normal hair pattern, no masses or tenderness   Eyes: EOMS Intact, PERRL, conjunctivae and lids unremarkable.  ENT: Ears, Nose and throat reveal no gross abnormalities.  No pallor or cyanosis.  Dentition good.   Neck: JVP normal, no  carotid bruit, thyroid not enlarged   Chest: Clear to auscultation bilaterally with good air movement and respiratory effort and no wheezes, rales, or rhonchi   Cardiovascular: Regular rhythm, S1 normal, S2 normal, No S3 or S4. Apical impulse not displaced. No murmur. No gallops or rubs detected   Abdomen: Soft, nontender, nondistended, with normoactive bowel sounds. No organomegaly.  No pulsatile masses, or bruits.   Extremities: Warm without edema. No clubbing, or cyanosis. All peripheral pulses are full and equal.   Neuro: Alert and oriented x3. No gross motor or sensory deficits noted, affect appropriate.        ECG: Sinus  Rhythm , HR 69  Low voltage -possible pulmonary disease.         LABS:   Lab Results   Component Value Date    WBC 6.69 07/25/2021    HGB 13.1 07/25/2021    HCT 39.6 07/25/2021    PLT 281 07/25/2021     Lab Results   Component Value Date    GLU 101 (H) 07/26/2021    BUN 16.0 07/26/2021    CREAT 0.6 07/26/2021    NA 138 07/26/2021    K 4.2 07/26/2021    CL 106 07/26/2021    CO2 26 07/26/2021    AST 15 07/25/2021    ALT 17 07/25/2021     Lab Results   Component Value Date    TSH 1.19 07/25/2021    HGBA1C 5.6 07/25/2021    BNP 22 07/25/2021     Lab Results   Component Value Date    CHOL 157 07/25/2021    TRIG 101 07/25/2021    HDL 41 07/25/2021    LDL 96 07/25/2021       Most recent echo and nuclear study reviewed.      IMPRESSION:   Rebecca Curry is a 50 y.o. female with the following problems:    Dizziness associated with palpitations -EKG here showing sinus rhythm with no PACs or PVCs.  Patient is also not orthostatic here with her standing blood pressure 120/78.  Seen at [facility] August 23 for chest pain -ACS ruled out with negative troponin EKG, negative CTA of the chest for PE. Resolved.  History of CHF with unclear past EF, underwent cardiac workup in [place] 2021 with LHC 2021 that patient reports showed no significant CAD.  Patient had a normal BNP in the hospital. Echo in  the hospital (07/25/21) which showed hyperdynamic EF 78%, normal LV size, normal RV size and function, mild AI, small apical pericardial effusion 0.8 cm thickness, no cardiac tamponade  DM, uncontrolled for many years, now diet controlled reportedly  HTN -controlled, did not tolerate 6.25 mg  of Coreg due to hypotension  Obesity BMI 34.9 with significant wt loss through diet alone 200 lb in last several years  60 pack year smoking history (2ppd), now abstinent  Family hx premature CAD half-brother MI in 59s, paternal grandmother first MI in 50s  Concerns over OSA-excessive daytime fatigue and drowsiness, snoring, frequent wakefulness      RECOMMENDATIONS:    3-day Zio patch monitoring to assess rhythm and rate that could be the cause of her lightheadedness  Sleep study given her complaint of excessive daytime fatigue, drowsiness, snoring and frequent wakefulness  Follow-up in 1 month for symptom reassessment                                                     Orders Placed This Encounter   Procedures    Long-term Holter Monitor (3 Days)    ECG 12 lead (Normal)    Polysomnogram    APP Office Visit (HRT [place])       No orders of the defined types were placed in this encounter.      SIGNED:    Joylene Igo, FNP          This note was generated by the Dragon speech recognition and may contain errors or omissions not intended by the user. Grammatical errors, random word insertions, deletions, pronoun errors, and incomplete sentences are occasional consequences of this technology due to software limitations. Not all errors are caught or corrected. If there are questions or concerns about the content of this note or information contained within the body of this dictation, they should be addressed directly with the author for clarification.

## 2021-08-10 ENCOUNTER — Ambulatory Visit (INDEPENDENT_AMBULATORY_CARE_PROVIDER_SITE_OTHER): Payer: Self-pay

## 2021-08-10 DIAGNOSIS — R42 Dizziness and giddiness: Secondary | ICD-10-CM

## 2021-08-10 DIAGNOSIS — G4710 Hypersomnia, unspecified: Secondary | ICD-10-CM

## 2021-08-10 DIAGNOSIS — R002 Palpitations: Secondary | ICD-10-CM

## 2021-08-10 NOTE — Progress Notes (Signed)
Instructions given to patient for their 3 days Zio Patch monitor. Registration and online enrollment completed. All patient questions answered.

## 2021-08-15 ENCOUNTER — Other Ambulatory Visit (INDEPENDENT_AMBULATORY_CARE_PROVIDER_SITE_OTHER): Payer: Self-pay

## 2021-08-15 NOTE — Telephone Encounter (Signed)
Refill voicemail: valsartan (DIOVAN) 160 MG tablet, Take 1 tablet (160 mg total) by mouth daily    Chart reviewed- patient saw Edwena Blow, NP in office on 08/08/2021. RX was sent on that date to [facility] [phone] as requested.    E-Prescribing Status: Receipt confirmed by pharmacy (08/08/2021  9:38 AM EDT)    No MyChart- called patient to let her know. She v/u and appreciated our help.

## 2021-09-07 ENCOUNTER — Encounter (INDEPENDENT_AMBULATORY_CARE_PROVIDER_SITE_OTHER): Payer: Self-pay | Admitting: Nurse Practitioner
